# Patient Record
Sex: Male | Born: 1965 | Race: Black or African American | Hispanic: No | Marital: Married | Smoking: Light tobacco smoker
Health system: Southern US, Community
[De-identification: ages and names within clinical notes are randomized; demographics above are authoritative.]

## PROBLEM LIST (undated history)

## (undated) DIAGNOSIS — R079 Chest pain, unspecified: Secondary | ICD-10-CM

## (undated) DIAGNOSIS — J309 Allergic rhinitis, unspecified: Secondary | ICD-10-CM

## (undated) DIAGNOSIS — F419 Anxiety disorder, unspecified: Secondary | ICD-10-CM

## (undated) DIAGNOSIS — T7840XA Allergy, unspecified, initial encounter: Secondary | ICD-10-CM

## (undated) HISTORY — DX: Chest pain, unspecified: R07.9

## (undated) HISTORY — DX: Allergy, unspecified, initial encounter: T78.40XA

## (undated) HISTORY — DX: Anxiety disorder, unspecified: F41.9

## (undated) HISTORY — PX: CHOLECYSTECTOMY: SHX55

## (undated) HISTORY — DX: Allergic rhinitis, unspecified: J30.9

---

## 2010-04-30 ENCOUNTER — Encounter: Payer: Self-pay | Admitting: Family Medicine

## 2010-04-30 ENCOUNTER — Ambulatory Visit: Payer: Self-pay | Admitting: Family Medicine

## 2010-04-30 DIAGNOSIS — J302 Other seasonal allergic rhinitis: Secondary | ICD-10-CM

## 2010-04-30 DIAGNOSIS — R209 Unspecified disturbances of skin sensation: Secondary | ICD-10-CM | POA: Insufficient documentation

## 2010-04-30 DIAGNOSIS — J3089 Other allergic rhinitis: Secondary | ICD-10-CM

## 2010-05-01 LAB — CONVERTED CEMR LAB
ALT: 50 units/L (ref 0–53)
BUN: 19 mg/dL (ref 6–23)
Basophils Absolute: 0 10*3/uL (ref 0.0–0.1)
Bilirubin, Direct: 0.1 mg/dL (ref 0.0–0.3)
Cholesterol: 162 mg/dL (ref 0–200)
Creatinine, Ser: 1 mg/dL (ref 0.4–1.5)
Eosinophils Relative: 1.4 % (ref 0.0–5.0)
GFR calc non Af Amer: 105.39 mL/min (ref >60)
Glucose, Bld: 91 mg/dL (ref 70–99)
HDL: 56.2 mg/dL (ref >39.00)
LDL Cholesterol: 98 mg/dL (ref 0–99)
Monocytes Absolute: 0.3 10*3/uL (ref 0.1–1.0)
Monocytes Relative: 5.1 % (ref 3.0–12.0)
Neutrophils Relative %: 42.1 % — ABNORMAL LOW (ref 43.0–77.0)
Platelets: 181 10*3/uL (ref 150.0–400.0)
RDW: 13.1 % (ref 11.5–14.6)
TSH: 1.65 microintl units/mL (ref 0.35–5.50)
Total Bilirubin: 0.6 mg/dL (ref 0.3–1.2)
Triglycerides: 38 mg/dL (ref 0.0–149.0)
VLDL: 7.6 mg/dL (ref 0.0–40.0)
WBC: 5.3 10*3/uL (ref 4.5–10.5)

## 2010-08-08 NOTE — Assessment & Plan Note (Signed)
Summary: NEW PT CPX AND FASTING LABS///SP[facility]   Vital Signs:  Patient profile:   45 year old male Height:      66 inches Weight:      194 pounds BMI:     31.43 Pulse rate:   62 / minute BP sitting:   112 / 84  (right arm)  Vitals Entered By: Doristine Devoid CMA (April 30, 2010 8:40 AM) CC: NEW EST- CPX AND LABS    History of Present Illness: 45 yo man here today for CPE.  Previous MD- none.    parasthesias- L arm.  started 10 days ago.  will start in the shoulder and radiate down the arm into the hand.  lifts regularly, no known injury.  denies weakness.  denies neck or shoulder pain.  no hx of similar.  feeling is constant.  nothing makes feeling better, nothing makes it worse.  Preventive Screening-Counseling & Management  Alcohol-Tobacco     Alcohol drinks/day: <1     Smoking Status: never  Caffeine-Diet-Exercise     Does Patient Exercise: yes     Type of exercise: lifting     Times/week: 3      Sexual History:  currently monogamous.        Drug Use:  never.    Current Medications (verified): 1)  None  Allergies (verified): 1)  ! 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  The patient denies anorexia, fever, weight loss, weight gain, vision loss, decreased hearing, hoarseness, syncope, dyspnea on exertion, peripheral edema, prolonged cough, headaches, abdominal pain, melena, hematochezia, severe indigestion/heartburn, hematuria, suspicious skin lesions, depression, abnormal bleeding, enlarged lymph nodes, and testicular masses.           chest pain- will have sharp, short pains in upper part of chest infrequently  Physical Exam  General:  Well-developed,well-nourished,in no acute distress; alert,appropriate and cooperative throughout examination Head:  Normocephalic and atraumatic without obvious abnormalities. No apparent alopecia or balding. Eyes:  eyes prominent.  No corneal or conjunctival inflammation noted. EOMI. Perrla. Funduscopic exam benign, without hemorrhages, exudates or papilledema. Vision grossly normal. Ears:  External ear exam shows no significant lesions or deformities.  Otoscopic examination reveals clear canals, tympanic membranes are intact bilaterally without bulging, retraction, inflammation or discharge. Hearing is grossly normal bilaterally. Nose:  External nasal examination shows no deformity or inflammation. Nasal mucosa are pink and moist without lesions or exudates. Mouth:  Oral mucosa and oropharynx without lesions or exudates.  Teeth in good repair. Neck:  No deformities, masses, or tenderness noted.  + trapezius spasm on L, very muscular Lungs:  Normal respiratory effort, chest expands symmetrically. Lungs are clear to auscultation, no crackles or wheezes. Heart:  Normal rate and regular rhythm. S1 and S2 normal without gallop, murmur, click, rub or other extra sounds. Abdomen:  Bowel sounds positive,abdomen soft and non-tender without masses, organomegaly or hernias noted. Genitalia:  Testes bilaterally descended without nodularity, tenderness or masses. No scrotal masses or lesions. No penis lesions or urethral discharge. Pulses:  +2 carotid, radial, DP Extremities:  No clubbing, cyanosis,  edema, or deformity noted with normal full range of motion of all joints.   Neurologic:  No cranial nerve deficits noted. Station and gait are normal. Plantar reflexes are down-going bilaterally. DTRs are symmetrical throughout. Sensory, motor and coordinative functions appear intact. Skin:  Intact  without suspicious lesions or rashes Cervical Nodes:  No lymphadenopathy noted Inguinal Nodes:  No significant adenopathy Psych:  Cognition and judgment appear intact. Alert and cooperative with normal attention span and concentration. No apparent delusions, illusions, hallucinations   Impression & Recommendations:  Problem # 1:  PHYSICAL EXAMINATION (ICD-V70.0) Assessment New pt's PE WNL w/ exception of trap spasm (see below).  check labs.  EKG obtained as baseline- normal.  anticipatory guidance provided. Orders: Venipuncture (60454) Specimen Handling (09811) EKG w/ Interpretation (93000) TLB-Lipid Panel (80061-LIPID) TLB-BMP (Basic Metabolic Panel-BMET) (80048-METABOL) TLB-CBC Platelet - w/Differential (85025-CBCD) TLB-Hepatic/Liver Function Pnl (80076-HEPATIC) TLB-TSH (Thyroid Stimulating Hormone) (84443-TSH)  Problem # 2:  PARESTHESIA (ICD-782.0) Assessment: New pt w/ tight trap spasm on L.  can reproduce parasthesia w/ compression of trap.  most likely due to nerve compression due to muscle spasm.  start NSAIDs and muscle relaxers.  if no improvement in 2 weeks will refer to ortho.  Complete Medication List: 1)  Naprosyn 500 Mg Tabs (Naproxen) .Marland Kitchen.. 1 two times a day x7-10 days and then as needed.  take w/ food. 2)  Cyclobenzaprine Hcl 10 Mg Tabs (Cyclobenzaprine hcl) .Marland Kitchen.. 1 by mouth 2 times daily as needed for back pain.  will cause drowsiness  Patient Instructions: 1)  Please call if your arm numbness isn't better in the next 10-14 days 2)  We'll notify you of your lab results 3)  Start the Naprosyn as directed for inflammation and pain- take w/ food 4)  Use the cyclobenzaprine (muscle relaxer) at night- will cause drowsiness 5)  Use a heating pad for pain relief 6)  Call with any questions or concerns 7)  Welcome!  We're glad to have you! Prescriptions: CYCLOBENZAPRINE HCL 10 MG  TABS (CYCLOBENZAPRINE HCL) 1 by mouth 2 times daily as needed for back pain.  will cause  drowsiness  #20 x 0   Entered and Authorized by:   Neena Rhymes MD   Signed by:   Neena Rhymes MD on 04/30/2010   Method used:   Print then Give to Patient   RxID:   [id number] NAPROSYN 500 MG TABS (NAPROXEN) 1 two times a day x7-10 days and then as needed.  take w/ food.  #60 x 0   Entered and Authorized by:   Neena Rhymes MD   Signed by:   Neena Rhymes MD on 04/30/2010   Method used:   Print then Give to Patient   RxID:   [id number]    Orders Added: 1)  Venipuncture [40102] 2)  Specimen Handling [99000] 3)  EKG w/ Interpretation [93000] 4)  TLB-Lipid Panel [80061-LIPID] 5)  TLB-BMP (Basic Metabolic Panel-BMET) [80048-METABOL] 6)  TLB-CBC Platelet - w/Differential [85025-CBCD] 7)  TLB-Hepatic/Liver Function Pnl [80076-HEPATIC] 8)  TLB-TSH (Thyroid Stimulating Hormone) [84443-TSH] 9)  New Patient 40-64 years [99386] 43)  New Patient Level II [72536]

## 2012-07-09 ENCOUNTER — Encounter: Payer: Self-pay | Admitting: Family Medicine

## 2012-12-06 ENCOUNTER — Ambulatory Visit (INDEPENDENT_AMBULATORY_CARE_PROVIDER_SITE_OTHER): Payer: Managed Care, Other (non HMO) | Admitting: Family Medicine

## 2012-12-06 ENCOUNTER — Encounter: Payer: Self-pay | Admitting: Family Medicine

## 2012-12-06 VITALS — BP 108/80 | HR 96 | Temp 98.2°F | Ht 65.25 in | Wt 194.4 lb

## 2012-12-06 DIAGNOSIS — R209 Unspecified disturbances of skin sensation: Secondary | ICD-10-CM

## 2012-12-06 DIAGNOSIS — Z0000 Encounter for general adult medical examination without abnormal findings: Secondary | ICD-10-CM

## 2012-12-06 DIAGNOSIS — G4733 Obstructive sleep apnea (adult) (pediatric): Secondary | ICD-10-CM | POA: Insufficient documentation

## 2012-12-06 LAB — BASIC METABOLIC PANEL
BUN: 20 mg/dL (ref 6–23)
CO2: 25 mEq/L (ref 19–32)
Calcium: 9.6 mg/dL (ref 8.4–10.5)
Creatinine, Ser: 1.2 mg/dL (ref 0.4–1.5)
GFR: 80.34 mL/min (ref >60.00)
Glucose, Bld: 98 mg/dL (ref 70–99)
Sodium: 138 mEq/L (ref 135–145)

## 2012-12-06 LAB — CBC WITH DIFFERENTIAL/PLATELET
Basophils Relative: 0.5 % (ref 0.0–3.0)
Eosinophils Relative: 1.4 % (ref 0.0–5.0)
HCT: 46.1 % (ref 39.0–52.0)
Hemoglobin: 15.8 g/dL (ref 13.0–17.0)
Lymphs Abs: 2.3 10*3/uL (ref 0.7–4.0)
MCV: 89.1 fl (ref 78.0–100.0)
Monocytes Absolute: 0.3 10*3/uL (ref 0.1–1.0)
Monocytes Relative: 5.3 % (ref 3.0–12.0)
Neutro Abs: 2.7 10*3/uL (ref 1.4–7.7)
Platelets: 224 10*3/uL (ref 150.0–400.0)
WBC: 5.4 10*3/uL (ref 4.5–10.5)

## 2012-12-06 LAB — LIPID PANEL
Cholesterol: 157 mg/dL (ref 0–200)
HDL: 66.2 mg/dL (ref >39.00)
Total CHOL/HDL Ratio: 2
Triglycerides: 75 mg/dL (ref 0.0–149.0)

## 2012-12-06 LAB — TSH: TSH: 1.47 u[IU]/mL (ref 0.35–5.50)

## 2012-12-06 LAB — HEPATIC FUNCTION PANEL
Albumin: 3.9 g/dL (ref 3.5–5.2)
Total Protein: 7.4 g/dL (ref 6.0–8.3)

## 2012-12-06 MED ORDER — MELOXICAM 15 MG PO TABS: 15.0000 mg | ORAL_TABLET | Freq: Every day | ORAL | Status: DC

## 2012-12-06 NOTE — Progress Notes (Signed)
  Subjective:    Patient ID: Daniel Mccoy, male    DOB: 11-May-1966, 47 y.o.   MRN: [id number]  HPI Snoring- pt's wife reports 'he'll snore really hard and then I won't hear nothing'.  Will only sleep a few hours at a time.  Will wake up tired.  Increased stress during the day.  Wife reports he has always snored but worse recently.  Wife reports breathing pauses w/ gasping for air.  No known family hx of sleep apnea.  Wife reports pt will fall asleep at stop lights, jazz concerts, during conversation.  Numbness in R hand- numbness in all fingers.  sxs are intermittent.  sxs started ~1 week ago.  No known injury.  Pt works as Marketing executive w/ some computer work.  Pt is ambidextrous.  Pt lifts weights regularly.   Review of Systems For ROS see HPI     Objective:   Physical Exam  Vitals reviewed. Constitutional: He is oriented to person, place, and time. He appears well-developed and well-nourished. No distress.  HENT:  Head: Normocephalic and atraumatic.  Narrow palantine arch  Eyes: Conjunctivae and EOM are normal. Pupils are equal, round, and reactive to light.  Neck: Normal range of motion. Neck supple. No thyromegaly present.  Very muscular  Cardiovascular: Normal rate, regular rhythm, normal heart sounds and intact distal pulses.   No murmur heard. Pulmonary/Chest: Effort normal and breath sounds normal. No respiratory distress.  Abdominal: Soft. Bowel sounds are normal. He exhibits no distension.  Musculoskeletal: He exhibits no edema.  Lymphadenopathy:    He has no cervical adenopathy.  Neurological: He is alert and oriented to person, place, and time. He has normal reflexes. No cranial nerve deficit.  + tinnel's on R, (-) phalen's.  Skin: Skin is warm and dry.  Psychiatric: He has a normal mood and affect. His behavior is normal.          Assessment & Plan:

## 2012-12-06 NOTE — Patient Instructions (Addendum)
Schedule your complete physical at your convenience in the next 6-8 weeks We'll notify you of your lab results Someone will call you with your hand surgery and pulmonary appts Start the Mobic daily for inflammation Call with any questions or concerns Hang in there!!!

## 2012-12-07 ENCOUNTER — Encounter: Payer: Self-pay | Admitting: General Practice

## 2012-12-07 NOTE — Assessment & Plan Note (Signed)
Pt having continued numbness in R hand.  Suspect component of carpal tunnel.  Start daily NSAID.  Refer to hand specialist.  Pt expressed understanding and is in agreement w/ plan.

## 2012-12-07 NOTE — Assessment & Plan Note (Signed)
Pt's wife's description is highly suspicious for OSA.  Refer to pulm for complete evaluation and tx.  Pt expressed understanding and is in agreement w/ plan.

## 2012-12-21 ENCOUNTER — Institutional Professional Consult (permissible substitution): Payer: Managed Care, Other (non HMO) | Admitting: Pulmonary Disease

## 2013-02-01 ENCOUNTER — Institutional Professional Consult (permissible substitution): Payer: Managed Care, Other (non HMO) | Admitting: Pulmonary Disease

## 2013-10-19 ENCOUNTER — Encounter: Payer: Self-pay | Admitting: Family Medicine

## 2013-10-19 ENCOUNTER — Ambulatory Visit (INDEPENDENT_AMBULATORY_CARE_PROVIDER_SITE_OTHER): Payer: Managed Care, Other (non HMO) | Admitting: Family Medicine

## 2013-10-19 VITALS — BP 120/82 | HR 62 | Temp 98.0°F | Resp 16 | Wt 202.2 lb

## 2013-10-19 DIAGNOSIS — F411 Generalized anxiety disorder: Secondary | ICD-10-CM

## 2013-10-19 DIAGNOSIS — R079 Chest pain, unspecified: Secondary | ICD-10-CM | POA: Insufficient documentation

## 2013-10-19 MED ORDER — FLUOXETINE HCL 20 MG PO TABS: 20.0000 mg | ORAL_TABLET | Freq: Every day | ORAL | Status: DC

## 2013-10-19 NOTE — Assessment & Plan Note (Signed)
New.  Start medication.  Stressed need to find emotional outlet.  Will follow.

## 2013-10-19 NOTE — Patient Instructions (Signed)
Follow up in 4-6 weeks to recheck stress Start the Fluoxetine daily for mood and stress Try and find a stress outlet- exercise, art, music (you can even talk about how you feel!) Call with any questions or concerns Hang in there!!

## 2013-10-19 NOTE — Progress Notes (Signed)
   Subjective:    Patient ID: Daniel Mccoy, male    DOB: Dec 23, 1965, 48 y.o.   MRN: [id number]  HPI 'stress'- pt reports L arm tingling.  'he goes from 0 to 100 just like that' per wife.  'he's bringing a lot of anxiety from work home.  Whole demeanor has changed'.  Sleeping w/o difficulty but has increased desire to sleep.  Some decreased motivation.  Feeling 'blah'.  Not sad or tearful.  Mild CP on L.   Review of Systems For ROS see HPI     Objective:   Physical Exam  Vitals reviewed. Constitutional: He is oriented to person, place, and time. He appears well-developed and well-nourished. No distress.  HENT:  Head: Normocephalic and atraumatic.  Eyes: Conjunctivae and EOM are normal. Pupils are equal, round, and reactive to light.  Neck: Normal range of motion. Neck supple. No thyromegaly present.  Cardiovascular: Normal rate, regular rhythm, normal heart sounds and intact distal pulses.   No murmur heard. Pulmonary/Chest: Effort normal and breath sounds normal. No respiratory distress.  Abdominal: Soft. Bowel sounds are normal. He exhibits no distension.  Musculoskeletal: He exhibits no edema.  Lymphadenopathy:    He has no cervical adenopathy.  Neurological: He is alert and oriented to person, place, and time. No cranial nerve deficit.  Skin: Skin is warm and dry.  Psychiatric: He has a normal mood and affect. His behavior is normal.          Assessment & Plan:

## 2013-10-19 NOTE — Assessment & Plan Note (Signed)
New.  Atypical and unlikely to be cardiac.  EKG WNL.  Suspect this is all due to pt's anxiety.  Reviewed supportive care and red flags that should prompt return.  Pt expressed understanding and is in agreement w/ plan.

## 2013-10-19 NOTE — Progress Notes (Signed)
Pre visit review using our clinic review tool, if applicable. No additional management support is needed unless otherwise documented below in the visit note. 

## 2013-10-21 ENCOUNTER — Ambulatory Visit: Payer: Managed Care, Other (non HMO) | Admitting: Family Medicine

## 2013-11-23 ENCOUNTER — Ambulatory Visit: Payer: Managed Care, Other (non HMO) | Admitting: Family Medicine

## 2013-11-23 DIAGNOSIS — Z0289 Encounter for other administrative examinations: Secondary | ICD-10-CM

## 2014-11-21 ENCOUNTER — Telehealth: Payer: Self-pay | Admitting: Family Medicine

## 2014-11-21 NOTE — Telephone Encounter (Signed)
Pre Visit letter sent  °

## 2014-12-12 ENCOUNTER — Encounter: Payer: Self-pay | Admitting: Family Medicine

## 2014-12-12 ENCOUNTER — Ambulatory Visit (INDEPENDENT_AMBULATORY_CARE_PROVIDER_SITE_OTHER): Payer: BLUE CROSS/BLUE SHIELD | Admitting: Family Medicine

## 2014-12-12 VITALS — BP 122/82 | HR 67 | Temp 98.3°F | Resp 16 | Ht 65.0 in | Wt 197.4 lb

## 2014-12-12 DIAGNOSIS — Z0000 Encounter for general adult medical examination without abnormal findings: Secondary | ICD-10-CM

## 2014-12-12 LAB — CBC WITH DIFFERENTIAL/PLATELET
BASOS ABS: 0 10*3/uL (ref 0.0–0.1)
BASOS PCT: 0.4 % (ref 0.0–3.0)
Eosinophils Absolute: 0 10*3/uL (ref 0.0–0.7)
Eosinophils Relative: 1 % (ref 0.0–5.0)
HCT: 45.4 % (ref 39.0–52.0)
Hemoglobin: 15.3 g/dL (ref 13.0–17.0)
LYMPHS ABS: 2.3 10*3/uL (ref 0.7–4.0)
Lymphocytes Relative: 46.9 % — ABNORMAL HIGH (ref 12.0–46.0)
MCHC: 33.8 g/dL (ref 30.0–36.0)
MCV: 90.3 fl (ref 78.0–100.0)
Monocytes Absolute: 0.3 10*3/uL (ref 0.1–1.0)
Monocytes Relative: 7 % (ref 3.0–12.0)
NEUTROS ABS: 2.2 10*3/uL (ref 1.4–7.7)
NEUTROS PCT: 44.7 % (ref 43.0–77.0)
Platelets: 196 10*3/uL (ref 150.0–400.0)
RBC: 5.03 Mil/uL (ref 4.22–5.81)
RDW: 13.8 % (ref 11.5–15.5)
WBC: 4.9 10*3/uL (ref 4.0–10.5)

## 2014-12-12 LAB — BASIC METABOLIC PANEL
BUN: 20 mg/dL (ref 6–23)
CALCIUM: 9.4 mg/dL (ref 8.4–10.5)
CO2: 24 mEq/L (ref 19–32)
CREATININE: 0.93 mg/dL (ref 0.40–1.50)
Chloride: 105 mEq/L (ref 96–112)
GFR: 111.03 mL/min (ref >60.00)
Glucose, Bld: 96 mg/dL (ref 70–99)
Potassium: 4 mEq/L (ref 3.5–5.1)
Sodium: 137 mEq/L (ref 135–145)

## 2014-12-12 LAB — HEPATIC FUNCTION PANEL
ALK PHOS: 51 U/L (ref 39–117)
ALT: 31 U/L (ref 0–53)
AST: 20 U/L (ref 0–37)
Albumin: 4.3 g/dL (ref 3.5–5.2)
Bilirubin, Direct: 0.1 mg/dL (ref 0.0–0.3)
TOTAL PROTEIN: 6.9 g/dL (ref 6.0–8.3)
Total Bilirubin: 0.6 mg/dL (ref 0.2–1.2)

## 2014-12-12 LAB — LIPID PANEL
Cholesterol: 160 mg/dL (ref 0–200)
HDL: 57.6 mg/dL (ref >39.00)
LDL Cholesterol: 91 mg/dL (ref 0–99)
NonHDL: 102.4
Total CHOL/HDL Ratio: 3
Triglycerides: 58 mg/dL (ref 0.0–149.0)
VLDL: 11.6 mg/dL (ref 0.0–40.0)

## 2014-12-12 LAB — PSA: PSA: 0.32 ng/mL (ref 0.10–4.00)

## 2014-12-12 LAB — TSH: TSH: 1.48 u[IU]/mL (ref 0.35–4.50)

## 2014-12-12 NOTE — Progress Notes (Signed)
Pre visit review using our clinic review tool, if applicable. No additional management support is needed unless otherwise documented below in the visit note. 

## 2014-12-12 NOTE — Progress Notes (Signed)
   Subjective:    Patient ID: Daniel Mccoy, male    DOB: 03/13/1966, 49 y.o.   MRN: [id number]  HPI CPE- no concerns today   Review of Systems Patient reports no vision/hearing changes, anorexia, fever ,adenopathy, persistant/recurrent hoarseness, swallowing issues, chest pain, palpitations, edema, persistant/recurrent cough, hemoptysis, dyspnea (rest,exertional, paroxysmal nocturnal), gastrointestinal  bleeding (melena, rectal bleeding), abdominal pain, GU symptoms (dysuria, hematuria, voiding/incontinence issues) syncope, focal weakness, memory loss, numbness & tingling, skin/hair/nail changes, depression, anxiety, abnormal bruising/bleeding, musculoskeletal symptoms/signs.   + GERD    Objective:   Physical Exam BP 122/82 mmHg  Pulse 67  Temp(Src) 98.3 F (36.8 C) (Oral)  Resp 16  Ht 5\' 5"  (1.651 m)  Wt 197 lb 6 oz (89.529 kg)  BMI 32.85 kg/m2  SpO2 96%  General Appearance:    Alert, cooperative, no distress, appears stated age  Head:    Normocephalic, without obvious abnormality, atraumatic  Eyes:    PERRL, conjunctiva/corneas clear, EOM's intact, fundi    benign, both eyes       Ears:    Normal TM's and external ear canals, both ears  Nose:   Nares normal, septum midline, mucosa normal, no drainage   or sinus tenderness  Throat:   Lips, mucosa, and tongue normal; teeth and gums normal  Neck:   Supple, symmetrical, trachea midline, no adenopathy;       thyroid:  No enlargement/tenderness/nodules; no carotid   bruit or JVD  Back:     Symmetric, no curvature, ROM normal, no CVA tenderness  Lungs:     Clear to auscultation bilaterally, respirations unlabored  Chest wall:    No tenderness or deformity  Heart:    Regular rate and rhythm, S1 and S2 normal, no murmur, rub   or gallop  Abdomen:     Soft, non-tender, bowel sounds active all four quadrants,    no masses, no organomegaly  Genitalia:    Normal male without lesion, discharge or tenderness  Rectal:    Normal tone,  normal prostate, no masses or tenderness;   guaiac negative stool  Extremities:   Extremities normal, atraumatic, no cyanosis or edema  Pulses:   2+ and symmetric all extremities  Skin:   Skin color, texture, turgor normal, no rashes or lesions  Lymph nodes:   Cervical, supraclavicular, and axillary nodes normal  Neurologic:   CNII-XII intact. Normal strength, sensation and reflexes      throughout          Assessment & Plan:

## 2014-12-12 NOTE — Assessment & Plan Note (Signed)
Pt's PE WNL.  Check labs.  Anticipatory guidance provided.  

## 2014-12-12 NOTE — Patient Instructions (Signed)
Follow up in 1 year or as needed We'll notify you of your lab results and make any changes if needed Try and find a stress outlet- you deserve it! Try and limit acidic, spicy foods or carbonated beverages- especially all at once If you develop reflux, try Tums as needed Call with any questions or concerns Have a great summer!!!

## 2014-12-13 ENCOUNTER — Encounter: Payer: Self-pay | Admitting: General Practice

## 2015-12-14 ENCOUNTER — Encounter: Payer: BLUE CROSS/BLUE SHIELD | Admitting: Family Medicine

## 2016-10-29 ENCOUNTER — Encounter: Payer: Self-pay | Admitting: Family Medicine

## 2016-10-29 ENCOUNTER — Ambulatory Visit (INDEPENDENT_AMBULATORY_CARE_PROVIDER_SITE_OTHER): Payer: 59 | Admitting: Family Medicine

## 2016-10-29 VITALS — BP 118/82 | HR 81 | Temp 98.1°F | Resp 17 | Ht 65.0 in | Wt 194.0 lb

## 2016-10-29 DIAGNOSIS — G4733 Obstructive sleep apnea (adult) (pediatric): Secondary | ICD-10-CM

## 2016-10-29 DIAGNOSIS — Z1211 Encounter for screening for malignant neoplasm of colon: Secondary | ICD-10-CM | POA: Diagnosis not present

## 2016-10-29 DIAGNOSIS — Z0000 Encounter for general adult medical examination without abnormal findings: Secondary | ICD-10-CM | POA: Diagnosis not present

## 2016-10-29 LAB — HEPATIC FUNCTION PANEL
ALBUMIN: 4.6 g/dL (ref 3.5–5.2)
ALT: 27 U/L (ref 0–53)
AST: 20 U/L (ref 0–37)
Alkaline Phosphatase: 58 U/L (ref 39–117)
BILIRUBIN TOTAL: 0.7 mg/dL (ref 0.2–1.2)
Bilirubin, Direct: 0.2 mg/dL (ref 0.0–0.3)
TOTAL PROTEIN: 7.2 g/dL (ref 6.0–8.3)

## 2016-10-29 LAB — BASIC METABOLIC PANEL
BUN: 17 mg/dL (ref 6–23)
CALCIUM: 9.8 mg/dL (ref 8.4–10.5)
CO2: 29 meq/L (ref 19–32)
Chloride: 105 mEq/L (ref 96–112)
Creatinine, Ser: 0.97 mg/dL (ref 0.40–1.50)
GFR: 104.96 mL/min (ref >60.00)
GLUCOSE: 85 mg/dL (ref 70–99)
Potassium: 4.2 mEq/L (ref 3.5–5.1)
SODIUM: 140 meq/L (ref 135–145)

## 2016-10-29 LAB — LIPID PANEL
CHOLESTEROL: 156 mg/dL (ref 0–200)
HDL: 69.9 mg/dL (ref >39.00)
LDL Cholesterol: 77 mg/dL (ref 0–99)
NonHDL: 86.01
Total CHOL/HDL Ratio: 2
Triglycerides: 43 mg/dL (ref 0.0–149.0)
VLDL: 8.6 mg/dL (ref 0.0–40.0)

## 2016-10-29 LAB — CBC WITH DIFFERENTIAL/PLATELET
BASOS ABS: 0 10*3/uL (ref 0.0–0.1)
Basophils Relative: 0.4 % (ref 0.0–3.0)
EOS ABS: 0.1 10*3/uL (ref 0.0–0.7)
Eosinophils Relative: 1 % (ref 0.0–5.0)
HEMATOCRIT: 45.9 % (ref 39.0–52.0)
HEMOGLOBIN: 15.2 g/dL (ref 13.0–17.0)
Lymphocytes Relative: 51.9 % — ABNORMAL HIGH (ref 12.0–46.0)
Lymphs Abs: 2.8 10*3/uL (ref 0.7–4.0)
MCHC: 33.1 g/dL (ref 30.0–36.0)
MCV: 92.8 fl (ref 78.0–100.0)
MONO ABS: 0.3 10*3/uL (ref 0.1–1.0)
Monocytes Relative: 5.1 % (ref 3.0–12.0)
Neutro Abs: 2.2 10*3/uL (ref 1.4–7.7)
Neutrophils Relative %: 41.6 % — ABNORMAL LOW (ref 43.0–77.0)
Platelets: 207 10*3/uL (ref 150.0–400.0)
RBC: 4.94 Mil/uL (ref 4.22–5.81)
RDW: 13.6 % (ref 11.5–15.5)
WBC: 5.4 10*3/uL (ref 4.0–10.5)

## 2016-10-29 LAB — PSA: PSA: 0.37 ng/mL (ref 0.10–4.00)

## 2016-10-29 LAB — TSH: TSH: 1.6 u[IU]/mL (ref 0.35–4.50)

## 2016-10-29 NOTE — Assessment & Plan Note (Signed)
Pt's PE WNL w/ exception of being overweight and external rectal skin tag.  Due for colonoscopy- referral placed.  UTD on Tdap.  Anticipatory guidance provided.

## 2016-10-29 NOTE — Progress Notes (Signed)
Pre visit review using our clinic review tool, if applicable. No additional management support is needed unless otherwise documented below in the visit note. 

## 2016-10-29 NOTE — Progress Notes (Signed)
   Subjective:    Patient ID: Daniel Mccoy, male    DOB: 23-Mar-1966, 51 y.o.   MRN: [id number]  HPI CPE- UTD on Tdap.  Due for colonoscopy- pt open to referral.   Review of Systems Patient reports no vision/hearing changes, anorexia, fever ,adenopathy, persistant/recurrent hoarseness, swallowing issues, chest pain, palpitations, edema, persistant/recurrent cough, hemoptysis, dyspnea (rest,exertional, paroxysmal nocturnal), gastrointestinal  bleeding (melena, rectal bleeding), abdominal pain, excessive heart burn, GU symptoms (dysuria, hematuria, voiding/incontinence issues) syncope, focal weakness, memory loss, numbness & tingling, skin/hair/nail changes, depression, anxiety, abnormal bruising/bleeding, musculoskeletal symptoms/signs.   + increased snoring and feeling of being poorly rested- hx of OSA but not being tx'd    Objective:   Physical Exam BP 118/82   Pulse 81   Temp 98.1 F (36.7 C) (Oral)   Resp 17   Ht  (1.651 m)   Wt 194 lb (88 kg)   SpO2 98%   BMI 32.28 kg/m   General Appearance:    Alert, cooperative, no distress, appears stated age  Head:    Normocephalic, without obvious abnormality, atraumatic  Eyes:    PERRL, conjunctiva/corneas clear, EOM's intact, fundi    benign, both eyes       Ears:    Normal TM's and external ear canals, both ears  Nose:   Nares normal, septum midline, mucosa normal, no drainage   or sinus tenderness  Throat:   Lips, mucosa, and tongue normal; teeth and gums normal  Neck:   Supple, symmetrical, trachea midline, no adenopathy;       thyroid:  No enlargement/tenderness/nodules  Back:     Symmetric, no curvature, ROM normal, no CVA tenderness  Lungs:     Clear to auscultation bilaterally, respirations unlabored  Chest wall:    No tenderness or deformity  Heart:    Regular rate and rhythm, S1 and S2 normal, no murmur, rub   or gallop  Abdomen:     Soft, non-tender, bowel sounds active all four quadrants,    no masses, no  organomegaly  Genitalia:    Normal male without lesion, discharge or tenderness  Rectal:    Normal tone, normal prostate, no tenderness.  + external skin tag (pt was clenching very tightly during exam- making it difficult)  Extremities:   Extremities normal, atraumatic, no cyanosis or edema  Pulses:   2+ and symmetric all extremities  Skin:   Skin color, texture, turgor normal, no rashes or lesions  Lymph nodes:   Cervical, supraclavicular, and axillary nodes normal  Neurologic:   CNII-XII intact. Normal strength, sensation and reflexes      throughout          Assessment & Plan:

## 2016-10-29 NOTE — Patient Instructions (Addendum)
Follow up in 1 year or as needed We'll notify you of your lab results and make any changes if needed Continue to work on healthy diet and regular exercise- you can do it! We'll call you with your pulmonary appt for the sleep apnea We'll call you with your GI appt for the colonoscopy consultation Call with any questions or concerns Happy Early Birthday!!!

## 2016-10-29 NOTE — Assessment & Plan Note (Signed)
Pt has hx of this but is not currently being treated.  He is again having daytime sleepiness and his snoring is louder.  Will refer back to pulmonary.  Pt expressed understanding and is in agreement w/ plan.

## 2016-10-30 ENCOUNTER — Encounter: Payer: Self-pay | Admitting: General Practice

## 2016-12-03 ENCOUNTER — Encounter: Payer: Self-pay | Admitting: Family Medicine

## 2016-12-22 ENCOUNTER — Encounter: Payer: Self-pay | Admitting: Gastroenterology

## 2017-02-20 ENCOUNTER — Encounter: Payer: Self-pay | Admitting: Internal Medicine

## 2017-02-20 ENCOUNTER — Ambulatory Visit (INDEPENDENT_AMBULATORY_CARE_PROVIDER_SITE_OTHER): Payer: 59 | Admitting: Internal Medicine

## 2017-02-20 VITALS — BP 118/68 | HR 56 | Ht 66.0 in | Wt 196.4 lb

## 2017-02-20 DIAGNOSIS — J302 Other seasonal allergic rhinitis: Secondary | ICD-10-CM | POA: Diagnosis not present

## 2017-02-20 DIAGNOSIS — G4733 Obstructive sleep apnea (adult) (pediatric): Secondary | ICD-10-CM | POA: Diagnosis not present

## 2017-02-20 DIAGNOSIS — J3089 Other allergic rhinitis: Secondary | ICD-10-CM

## 2017-02-20 NOTE — Progress Notes (Signed)
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 Referred courtesy of Dr. Beverely Low. Pt states that he has never had a sleep study performed. Wife states that he does snore at night. Pt does not feel rested when he wakes up in the morning at times. And somewhat perennial nasal congestion. He has been working study third shift for several years. Usual bedtime 8:30 AM with 20 minutes sleep latency, waking 2 or 3 times before up between 2 and 3 PM. No sleep medication. "Much coffee and energy drinks". ENT surgery-none. Epworth sleepiness score 24/24. Wife says he snores worse on his back. She hears loud snoring and then witnesses apneas and she pushes him to get in breathing. They do not recognize movement disturbance or parasomnias. He denies heart or lung condition but admits seasonal and perennial nasal congestion.  Prior to Admission medications   Not on File   Past Medical History:  Diagnosis Date  . Allergic rhinitis    History reviewed. No pertinent surgical history. Family History  Problem Relation Age of Onset  . Diabetes Mother   . Heart disease Mother   . Heart disease Maternal Grandmother   . Heart disease Paternal Grandmother    Social History   Social History  . Marital status: Single    Spouse name: N/A  . Number of children: N/A  . Years of education: N/A   Occupational History  . Not on file.   Social History Main Topics  . Smoking status: Current Some Day Smoker    Types: Cigars  . Smokeless tobacco: Never Used     Comment: Pt occassionally will smoke a cigar.  . Alcohol use Yes     Comment: social  . Drug use: No  . Sexual activity: Not on file   Other Topics Concern  . Not on file   Social History Narrative  . No narrative on file   ROS-see HPI   + = Positive Constitutional:    weight loss, night sweats, fevers, chills, +fatigue, lassitude. HEENT:    headaches, difficulty swallowing, tooth/dental problems, sore throat,   sneezing, itching, ear ache, nasal congestion, post nasal drip, snoring CV:    chest pain, orthopnea, PND, swelling in lower extremities, anasarca,                                                  dizziness, palpitations Resp:   shortness of breath with exertion or at rest.                productive cough,   non-productive cough, coughing up of blood.              change in color of mucus.  wheezing.   Skin:    rash or lesions. GI:  No-   heartburn, indigestion, abdominal pain, nausea, vomiting, diarrhea,                 change in bowel habits, loss of appetite GU: dysuria, change in color of urine, no urgency or frequency.   flank pain. MS:   joint pain, stiffness, decreased range of motion, back pain. Neuro-     nothing unusual Psych:  change in mood or affect.  depression or anxiety.   memory loss.  OBJ- Physical Exam General- Alert, Oriented, Affect-appropriate, Distress- none acute Skin- rash-none, lesions- none, excoriation- none  Lymphadenopathy- none Head- atraumatic            Eyes- Gross vision intact, PERRLA, conjunctivae and secretions clear            Ears- Hearing, canals-normal            Nose- Clear, no-Septal dev, mucus, polyps, erosion, perforation             Throat- Mallampati IV , mucosa clear , drainage- none, tonsils- atrophic Neck- flexible , trachea midline, no stridor , thyroid nl, carotid no bruit Chest - symmetrical excursion , unlabored           Heart/CV- RRR , no murmur , no gallop  , no rub, nl s1 s2                           - JVD- none , edema- none, stasis changes- none, varices- none           Lung- clear to P&A, wheeze- none, cough- none , dullness-none, rub- none           Chest wall-  Abd-  Br/ Gen/ Rectal- Not done, not indicated Extrem- cyanosis- none, clubbing, none, atrophy- none, strength- nl Neuro- grossly intact to observation

## 2017-02-20 NOTE — Assessment & Plan Note (Signed)
High probability diagnosis based on history and exam. We discussed the physiology of sleep apnea, basic sleep hygiene, medical concerns, responsibility to drive safely, testing process and common therapies. Plan-schedule sleep study. Depending on the result, if appropriate we will start therapy before his return appointment.

## 2017-02-20 NOTE — Patient Instructions (Signed)
Order- schedule unattended home sleep test     Dx OSA  Please call me for results and recommendation about 2 weeks after your sleep study

## 2017-02-20 NOTE — Assessment & Plan Note (Signed)
Wife recognizes he snores more if he is dealing with nasal congestion. May benefit from study use of Flonase during pollen seasons. Currently denies being symptomatic.

## 2017-02-24 ENCOUNTER — Ambulatory Visit (AMBULATORY_SURGERY_CENTER): Payer: Self-pay

## 2017-02-24 VITALS — Ht 67.0 in | Wt 197.8 lb

## 2017-02-24 DIAGNOSIS — Z1211 Encounter for screening for malignant neoplasm of colon: Secondary | ICD-10-CM

## 2017-02-24 MED ORDER — SUPREP BOWEL PREP KIT 17.5-3.13-1.6 GM/177ML PO SOLN: 1.0000 | Freq: Once | ORAL | 0 refills | Status: AC

## 2017-02-24 NOTE — Progress Notes (Signed)
No allergies to eggs or soy No diet meds No home oxygen No past problems with anesthesia  Declined emmi 

## 2017-02-25 ENCOUNTER — Encounter: Payer: Self-pay | Admitting: Gastroenterology

## 2017-02-26 ENCOUNTER — Telehealth: Payer: Self-pay | Admitting: Internal Medicine

## 2017-02-26 NOTE — Telephone Encounter (Signed)
Pt is returning a call regarding the home sleep test that was ordered at last OV.  Will route to [facility] for follow-up.

## 2017-02-27 NOTE — Telephone Encounter (Signed)
I have Daniel Mccoy scheduled to pick up the HST machine on 08/28/20188 @ 4:00pm. He works 3rd shift and the machine will not be back until 4 on Wednesday 03/04/17

## 2017-02-27 NOTE — Telephone Encounter (Signed)
Synetta Fail signed pt out for study.  I made her aware he has returned her call.

## 2017-03-02 ENCOUNTER — Ambulatory Visit (AMBULATORY_SURGERY_CENTER): Payer: 59 | Admitting: Gastroenterology

## 2017-03-02 ENCOUNTER — Encounter: Payer: Self-pay | Admitting: Gastroenterology

## 2017-03-02 VITALS — BP 116/68 | HR 45 | Temp 98.6°F | Resp 17 | Ht 66.0 in | Wt 196.0 lb

## 2017-03-02 DIAGNOSIS — Z1212 Encounter for screening for malignant neoplasm of rectum: Secondary | ICD-10-CM

## 2017-03-02 DIAGNOSIS — Z1211 Encounter for screening for malignant neoplasm of colon: Secondary | ICD-10-CM

## 2017-03-02 MED ORDER — SODIUM CHLORIDE 0.9 % IV SOLN: 500.0000 mL | INTRAVENOUS | Status: DC

## 2017-03-02 NOTE — Progress Notes (Signed)
Pt was given a work note to return tomorrow.  No problems noted in the recovery room.  maw

## 2017-03-02 NOTE — Progress Notes (Signed)
To recovery, report to RN, VSS. 

## 2017-03-02 NOTE — Patient Instructions (Signed)
YOU HAD AN ENDOSCOPIC PROCEDURE TODAY AT THE [facility]:   Refer to the procedure report that was given to you for any specific questions about what was found during the examination.  If the procedure report does not answer your questions, please call your gastroenterologist to clarify.  If you requested that your care partner not be given the details of your procedure findings, then the procedure report has been included in a sealed envelope for you to review at your convenience later.  YOU SHOULD EXPECT: Some feelings of bloating in the abdomen. Passage of more gas than usual.  Walking can help get rid of the air that was put into your GI tract during the procedure and reduce the bloating. If you had a lower endoscopy (such as a colonoscopy or flexible sigmoidoscopy) you may notice spotting of blood in your stool or on the toilet paper. If you underwent a bowel prep for your procedure, you may not have a normal bowel movement for a few days.  Please Note:  You might notice some irritation and congestion in your nose or some drainage.  This is from the oxygen used during your procedure.  There is no need for concern and it should clear up in a day or so.  SYMPTOMS TO REPORT IMMEDIATELY:   Following lower endoscopy (colonoscopy or flexible sigmoidoscopy):  Excessive amounts of blood in the stool  Significant tenderness or worsening of abdominal pains  Swelling of the abdomen that is new, acute  Fever of 100F or higher  For urgent or emergent issues, a gastroenterologist can be reached at any hour by calling [phone].   DIET:  We do recommend a small meal at first, but then you may proceed to your regular diet.  Drink plenty of fluids but you should avoid alcoholic beverages for 24 hours.  ACTIVITY:  You should plan to take it easy for the rest of today and you should NOT DRIVE or use heavy machinery until tomorrow (because of the sedation medicines used during the test).     FOLLOW UP: Our staff will call the number listed on your records the next business day following your procedure to check on you and address any questions or concerns that you may have regarding the information given to you following your procedure. If we do not reach you, we will leave a message.  However, if you are feeling well and you are not experiencing any problems, there is no need to return our call.  We will assume that you have returned to your regular daily activities without incident.  If any biopsies were taken you will be contacted by phone or by letter within the next 1-3 weeks.  Please call us at [phone] if you have not heard about the biopsies in 3 weeks.    SIGNATURES/CONFIDENTIALITY: You and/or your care partner have signed paperwork which will be entered into your electronic medical record.  These signatures attest to the fact that that the information above on your After Visit Summary has been reviewed and is understood.  Full responsibility of the confidentiality of this discharge information lies with you and/or your care-partner.    Handout was given to your care partner on diverticulosis. You may resume your current medications today. Repeat colonoscopy in 10 years. Please call if any questions or concerns.

## 2017-03-02 NOTE — Op Note (Signed)
[facility] Patient Name: Daniel Mccoy Procedure Date: 03/02/2017 11:05 AM MRN: [id number] Endoscopist: Sherilyn Cooter L. Myrtie Neither , MD Age: 51 Referring MD:  Date of Birth: February 03, 1966 Gender: Male Account #: [id number] Procedure:                Colonoscopy Indications:              Screening for colorectal malignant neoplasm, This                            is the patient's first colonoscopy Medicines:                Monitored Anesthesia Care Procedure:                Pre-Anesthesia Assessment:                           - Prior to the procedure, a History and Physical                            was performed, and patient medications and                            allergies were reviewed. The patient's tolerance of                            previous anesthesia was also reviewed. The risks                            and benefits of the procedure and the sedation                            options and risks were discussed with the patient.                            All questions were answered, and informed consent                            was obtained. Prior Anticoagulants: The patient has                            taken no previous anticoagulant or antiplatelet                            agents. ASA Grade Assessment: II - A patient with                            mild systemic disease. After reviewing the risks                            and benefits, the patient was deemed in                            satisfactory condition to undergo the procedure.  After obtaining informed consent, the colonoscope                            was passed under direct vision. Throughout the                            procedure, the patient's blood pressure, pulse, and                            oxygen saturations were monitored continuously. The                            Colonoscope was introduced through the anus and                            advanced to the the cecum,  identified by                            appendiceal orifice and ileocecal valve. The                            colonoscopy was performed without difficulty. The                            patient tolerated the procedure well. The quality                            of the bowel preparation was excellent. The                            ileocecal valve, appendiceal orifice, and rectum                            were photographed. The quality of the bowel                            preparation was evaluated using the BBPS [facility]                            Bowel Preparation Scale) with scores of: Right                            Colon = 3, Transverse Colon = 3 and Left Colon = 3                            (entire mucosa seen well with no residual staining,                            small fragments of stool or opaque liquid). The                            total BBPS score equals 9. The bowel preparation  used was SUPREP. Scope In: 11:11:58 AM Scope Out: 11:25:35 AM Scope Withdrawal Time: 0 hours 10 minutes 23 seconds  Total Procedure Duration: 0 hours 13 minutes 37 seconds  Findings:                 The perianal and digital rectal examinations were                            normal.                           A few medium-mouthed diverticula were found in the                            right colon.                           The exam was otherwise without abnormality on                            direct and retroflexion views. Complications:            No immediate complications. Estimated Blood Loss:     Estimated blood loss: none. Impression:               - Diverticulosis in the right colon.                           - The examination was otherwise normal on direct                            and retroflexion views.                           - No specimens collected. Recommendation:           - Patient has a contact number available for                             emergencies. The signs and symptoms of potential                            delayed complications were discussed with the                            patient. Return to normal activities tomorrow.                            Written discharge instructions were provided to the                            patient.                           - Resume previous diet.                           - Continue present medications.                           -  Repeat colonoscopy in 10 years for screening                            purposes. Henry L. Myrtie Neither, MD 03/02/2017 11:28:42 AM This report has been signed electronically.

## 2017-03-03 ENCOUNTER — Telehealth: Payer: Self-pay | Admitting: Internal Medicine

## 2017-03-03 ENCOUNTER — Telehealth: Payer: Self-pay

## 2017-03-03 NOTE — Telephone Encounter (Signed)
PCC's please advise. Thanks 

## 2017-03-03 NOTE — Telephone Encounter (Signed)
  Follow up Call-  Call back number 03/02/2017  Post procedure Call Back phone  # [phone]  Some recent data might be hidden     Patient questions:  Do you have a fever, pain , or abdominal swelling? No. Pain Score  0 *  Have you tolerated food without any problems? Yes.    Have you been able to return to your normal activities? Yes.    Do you have any questions about your discharge instructions: Diet   No. Medications  No. Follow up visit  No.  Do you have questions or concerns about your Care? No.  Actions: * If pain score is 4 or above: No action needed, pain <4.

## 2017-03-03 NOTE — Telephone Encounter (Signed)
  Follow up Call-  Call back number 03/02/2017  Post procedure Call Back phone  # [phone]  Some recent data might be hidden     Left message

## 2017-03-04 NOTE — Telephone Encounter (Signed)
I have spoken with Mr. Toews and we have him rescheduled to pick up HST Machine 03/05/2017 @ 3:00pm

## 2017-03-09 DIAGNOSIS — G4733 Obstructive sleep apnea (adult) (pediatric): Secondary | ICD-10-CM | POA: Diagnosis not present

## 2017-03-23 ENCOUNTER — Telehealth: Payer: Self-pay | Admitting: Internal Medicine

## 2017-03-23 ENCOUNTER — Other Ambulatory Visit: Payer: Self-pay | Admitting: *Deleted

## 2017-03-23 DIAGNOSIS — G4733 Obstructive sleep apnea (adult) (pediatric): Secondary | ICD-10-CM

## 2017-03-23 NOTE — Telephone Encounter (Signed)
Spoke with pt and advised that per DR Young sleep study results are not available at this time.  PT advised to call back next week.  Pt verbalized understanding.  Nothing further needed

## 2017-03-23 NOTE — Telephone Encounter (Signed)
The study has been read, but results are still being scanned into the computer and are not available to me yet. Please call back next week.

## 2017-03-23 NOTE — Telephone Encounter (Signed)
Spoke with pt, he states he had the sleep study about a week and a half ago and was wondering if CY had the results. He states he received the equipment from our clinic. Please advise CY. Thanks.  Current Outpatient Prescriptions on File Prior to Visit  Medication Sig Dispense Refill  . Multiple Vitamin (MULTIVITAMIN) capsule Take 1 capsule by mouth daily.    Marland Kitchen UNABLE TO FIND Med Name:Prework out powder.     Current Facility-Administered Medications on File Prior to Visit  Medication Dose Route Frequency Provider Last Rate Last Dose  . 0.9 %  sodium chloride infusion  500 mL Intravenous Continuous Sherrilyn Rist, MD        Allergies  Allergen Reactions  . Shellfish Allergy Hives, Swelling and Rash

## 2017-04-22 ENCOUNTER — Telehealth: Payer: Self-pay | Admitting: Internal Medicine

## 2017-04-22 DIAGNOSIS — G4733 Obstructive sleep apnea (adult) (pediatric): Secondary | ICD-10-CM

## 2017-04-22 NOTE — Telephone Encounter (Signed)
Pt is calling for his sleep study results.  CY please advise. Thanks

## 2017-04-22 NOTE — Telephone Encounter (Signed)
Resulted

## 2017-04-22 NOTE — Telephone Encounter (Signed)
Notes recorded by Waymon BudgeYoung, Clinton D, MD on 04/22/2017 at 3:28 PM EDT Home Sleep Test-sleep study showed mild to moderate obstructive sleep apnea, averaging 14 apneas per hour with drops in blood oxygen level. I recommend he start with CPAP. If nasal congestion is a problem, recommend he start OTC Flonase nasal spray, one or 2 sprays each nostril every night at bedtime. Over several days that should help.  Plan-order new DME, knee CPAP auto 5-20, mask of choice, supplies,, humidifier, Airview  Dx OSA  Please be sure he has a return appointment per insurance rags, 31-90 days.   Spoke with patient. He is aware of results. Wishes to proceed with CPAP therapy. Will go ahead and place order. Advised patient to contact the office as soon as he receives his machine so that we can get him scheduled for a follow up. He verbalized understanding. Nothing else needed at time of call.

## 2017-06-04 ENCOUNTER — Encounter: Payer: 59 | Admitting: Internal Medicine

## 2017-06-04 NOTE — Progress Notes (Signed)
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 Referred courtesy of Dr. Beverely Lowabori. Pt states that he has never had a sleep study performed. Wife states that he does snore at night. Pt does not feel rested when he wakes up in the morning at times. And somewhat perennial nasal congestion. He has been working study third shift for several years. Usual bedtime 8:30 AM with 20 minutes sleep latency, waking 2 or 3 times before up between 2 and 3 PM. No sleep medication. "Much coffee and energy drinks". ENT surgery-none. Epworth sleepiness score 24/24. Wife says he snores worse on his back. She hears loud snoring and then witnesses apneas and she pushes him to get in breathing. They do not recognize movement disturbance or parasomnias. He denies heart or lung condition but admits seasonal and perennial nasal congestion.  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 HEENT:    headaches, difficulty swallowing, tooth/dental problems, sore throat,       sneezing, itching, ear ache, nasal congestion, post nasal drip, snoring CV:    chest pain, orthopnea, PND, swelling in lower extremities, anasarca,                                                  dizziness, palpitations Resp:   shortness of breath with exertion or at rest.                productive cough,   non-productive cough, coughing up of blood.              change in color of mucus.  wheezing.   Skin:    rash or lesions. GI:  No-   heartburn, indigestion, abdominal pain, nausea, vomiting, diarrhea,                 change in bowel habits, loss of appetite GU: dysuria, change in color of urine, no urgency or frequency.   flank pain. MS:   joint pain, stiffness, decreased range of motion, back pain. Neuro-     nothing unusual Psych:  change in mood or affect.  depression or anxiety.    memory loss.  OBJ- Physical Exam General- Alert, Oriented, Affect-appropriate, Distress- none acute Skin- rash-none, lesions- none, excoriation- none Lymphadenopathy- none Head- atraumatic            Eyes- Gross vision intact, PERRLA, conjunctivae and secretions clear            Ears- Hearing, canals-normal            Nose- Clear, no-Septal dev, mucus, polyps, erosion, perforation             Throat- Mallampati IV , mucosa clear , drainage- none, tonsils- atrophic Neck- flexible , trachea midline, no stridor , thyroid nl, carotid no bruit Chest - symmetrical excursion , unlabored           Heart/CV- RRR , no murmur , no gallop  , no rub, nl s1 s2                           - JVD- none , edema- none, stasis changes- none, varices- none  Lung- clear to P&A, wheeze- none, cough- none , dullness-none, rub- none           Chest wall-  Abd-  Br/ Gen/ Rectal- Not done, not indicated Extrem- cyanosis- none, clubbing, none, atrophy- none, strength- nl Neuro- grossly intact to observation

## 2017-07-03 ENCOUNTER — Encounter: Payer: Self-pay | Admitting: Internal Medicine

## 2017-07-03 ENCOUNTER — Ambulatory Visit: Payer: 59 | Admitting: Internal Medicine

## 2017-07-03 VITALS — BP 114/62 | HR 78 | Ht 66.0 in | Wt 202.4 lb

## 2017-07-03 DIAGNOSIS — J302 Other seasonal allergic rhinitis: Secondary | ICD-10-CM

## 2017-07-03 DIAGNOSIS — G4733 Obstructive sleep apnea (adult) (pediatric): Secondary | ICD-10-CM | POA: Diagnosis not present

## 2017-07-03 DIAGNOSIS — J3089 Other allergic rhinitis: Secondary | ICD-10-CM | POA: Diagnosis not present

## 2017-07-03 NOTE — Patient Instructions (Signed)
Order- DME [facility]- please change CPAP to auto 5-12, mask of choice, humidifier, supplies, AirView   Dx OSA  Please call us as needed so we can help you get comfortable with CPAP.

## 2017-07-03 NOTE — Assessment & Plan Note (Signed)
Current nasal symptoms have been most consistent with dryness from CPAP airflow and indoor heat.  We discussed adjustment of his CPAP humidifier, use of nasal gel.

## 2017-07-03 NOTE — Progress Notes (Signed)
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 Referred courtesy of Dr. Beverely Lowabori. Pt states that he has never had a sleep study performed. Wife states that he does snore at night. Pt does not feel rested when he wakes up in the morning at times. And somewhat perennial nasal congestion. He has been working steady third shift for several years. Usual bedtime 8:30 AM with 20 minutes sleep latency, waking 2 or 3 times before up between 2 and 3 PM. No sleep medication. "Much coffee and energy drinks". ENT surgery-none. Epworth sleepiness score 24/24. Wife says he snores worse on his back. She hears loud snoring and then witnesses apneas and she pushes him to get in breathing. They do not recognize movement disturbance or parasomnias. He denies heart or lung condition but admits seasonal and perennial nasal congestion.  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 States machine has been blowing out too much air. Uses [facility] as his DME.  Here with wife who says he is not snoring at all with CPAP.  Still works third shift.  Pressure has been uncomfortably high at times and he has had problems with dry nose-helped by saline nasal gel. Download 43% compliance, AHI 0.3/hour.  He feels he is getting used to CPAP and that he does sleep better wearing it.  ROS-see HPI   + = Positive Constitutional:    weight loss, night sweats, fevers, chills, +fatigue, lassitude. HEENT:    headaches, difficulty swallowing, tooth/dental problems, sore throat,       sneezing, itching, ear ache, nasal congestion, post nasal drip, snoring CV:    chest pain, orthopnea, PND, swelling in lower extremities, anasarca,                                                  dizziness, palpitations Resp:   shortness of breath with  exertion or at rest.                productive cough,   non-productive cough, coughing up of blood.              change in color of mucus.  wheezing.   Skin:    rash or lesions. GI:  No-   heartburn, indigestion, abdominal pain, nausea, vomiting, diarrhea,                 change in bowel habits, loss of appetite GU: dysuria, change in color of urine, no urgency or frequency.   flank pain. MS:   joint pain, stiffness, decreased range of motion, back pain. Neuro-     nothing unusual Psych:  change in mood or affect.  depression or anxiety.   memory loss.  OBJ- Physical Exam General- Alert, Oriented, Affect-appropriate, Distress- none acute, looks well  Skin- rash-none, lesions- none, excoriation- none Lymphadenopathy- none Head- atraumatic            Eyes- Gross vision intact, PERRLA, conjunctivae and secretions clear            Ears- Hearing, canals-normal            Nose- Clear, no-Septal dev, mucus, polyps, erosion, perforation  Throat- Mallampati IV , mucosa clear , drainage- none, tonsils- atrophic Neck- flexible , trachea midline, no stridor , thyroid nl, carotid no bruit Chest - symmetrical excursion , unlabored           Heart/CV- RRR , no murmur , no gallop  , no rub, nl s1 s2                           - JVD- none , edema- none, stasis changes- none, varices- none           Lung- clear to P&A, wheeze- none, cough- none , dullness-none, rub- none           Chest wall-  Abd-  Br/ Gen/ Rectal- Not done, not indicated Extrem- cyanosis- none, clubbing, none, atrophy- none, strength- nl Neuro- grossly intact to observation

## 2017-07-03 NOTE — Assessment & Plan Note (Signed)
He had his wife both say he does better with CPAP.  He has had some comfort problems with mask, pressure leaks and dry nose.  We think we can deal with those by reducing the pressure range some and with education. Plan-reduce auto range to 5-12

## 2017-11-02 ENCOUNTER — Ambulatory Visit: Payer: 59 | Admitting: Internal Medicine

## 2018-01-15 ENCOUNTER — Ambulatory Visit: Payer: 59 | Admitting: Internal Medicine

## 2018-04-22 ENCOUNTER — Ambulatory Visit: Payer: 59 | Admitting: Internal Medicine

## 2018-11-18 ENCOUNTER — Encounter: Payer: 59 | Admitting: Family Medicine

## 2019-01-31 ENCOUNTER — Other Ambulatory Visit: Payer: Self-pay

## 2019-01-31 ENCOUNTER — Encounter

## 2019-01-31 ENCOUNTER — Ambulatory Visit (INDEPENDENT_AMBULATORY_CARE_PROVIDER_SITE_OTHER): Payer: 59 | Admitting: Family Medicine

## 2019-01-31 ENCOUNTER — Encounter: Payer: Self-pay | Admitting: Family Medicine

## 2019-01-31 VITALS — BP 118/78 | HR 78 | Temp 98.1°F | Resp 16 | Ht 66.0 in | Wt 189.4 lb

## 2019-01-31 DIAGNOSIS — M7712 Lateral epicondylitis, left elbow: Secondary | ICD-10-CM

## 2019-01-31 DIAGNOSIS — E669 Obesity, unspecified: Secondary | ICD-10-CM | POA: Diagnosis not present

## 2019-01-31 DIAGNOSIS — F411 Generalized anxiety disorder: Secondary | ICD-10-CM

## 2019-01-31 DIAGNOSIS — Z125 Encounter for screening for malignant neoplasm of prostate: Secondary | ICD-10-CM

## 2019-01-31 DIAGNOSIS — Z0001 Encounter for general adult medical examination with abnormal findings: Secondary | ICD-10-CM

## 2019-01-31 DIAGNOSIS — Z0000 Encounter for general adult medical examination without abnormal findings: Secondary | ICD-10-CM

## 2019-01-31 MED ORDER — MELOXICAM 15 MG PO TABS: 15.0000 mg | ORAL_TABLET | Freq: Every day | ORAL | 0 refills | Status: DC

## 2019-01-31 MED ORDER — FLUOXETINE HCL 20 MG PO CAPS: 20.0000 mg | ORAL_CAPSULE | Freq: Every day | ORAL | 3 refills | Status: DC

## 2019-01-31 NOTE — Progress Notes (Signed)
   Subjective:    Patient ID: Daniel Mccoy, male    DOB: 1965-08-14, 53 y.o.   MRN: [id number]  HPI CPE- UTD on colonoscopy, Tdap.  Pt is moving to [place]   Review of Systems Patient reports no vision/hearing changes, anorexia, fever ,adenopathy, persistant/recurrent hoarseness, swallowing issues, chest pain, palpitations, edema, persistant/recurrent cough, hemoptysis, dyspnea (rest,exertional, paroxysmal nocturnal), gastrointestinal  bleeding (melena, rectal bleeding), abdominal pain, excessive heart burn, GU symptoms (dysuria, hematuria, voiding/incontinence issues) syncope, focal weakness, memory loss, numbness & tingling, skin/hair/nail changes, depression, abnormal bruising/bleeding  L lateral epicondylitis- sxs started 3-4 weeks ago.  Pt does a lot of repetitive motion at work.  Hx of similar when pt was lifting weights.  + anxiety- pt reports this has been 'for a little bit'.  Interferes w/ sleep.  Leads to feeling overwhelmed.  Previously on Fluoxetine w/ good results.    Objective:   Physical Exam BP 118/78   Pulse 78   Temp 98.1 F (36.7 C) (Tympanic)   Resp 16   Ht 5\' 6"  (1.676 m)   Wt 189 lb 6 oz (85.9 kg)   SpO2 98%   BMI 30.57 kg/m   General Appearance:    Alert, cooperative, no distress, appears stated age  Head:    Normocephalic, without obvious abnormality, atraumatic  Eyes:    PERRL, conjunctiva/corneas clear, EOM's intact, fundi    benign, both eyes       Ears:    Normal TM's and external ear canals, both ears  Nose:   Nares normal, septum midline, mucosa normal, no drainage   or sinus tenderness  Throat:   Lips, mucosa, and tongue normal; teeth and gums normal  Neck:   Supple, symmetrical, trachea midline, no adenopathy;       thyroid:  No enlargement/tenderness/nodules  Back:     Symmetric, no curvature, ROM normal, no CVA tenderness  Lungs:     Clear to auscultation bilaterally, respirations unlabored  Chest wall:    No tenderness or deformity  Heart:     Regular rate and rhythm, S1 and S2 normal, no murmur, rub   or gallop  Abdomen:     Soft, non-tender, bowel sounds active all four quadrants,    no masses, no organomegaly  Genitalia:    Normal male without lesion, discharge or tenderness  Rectal:    Normal tone, normal prostate, no masses or tenderness  Extremities:   TTP over L lateral  epicondyle  Pulses:   2+ and symmetric all extremities  Skin:   Skin color, texture, turgor normal, no rashes or lesions  Lymph nodes:   Cervical, supraclavicular, and axillary nodes normal  Neurologic:   CNII-XII intact. 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  Reviewed dx and tx plan w/ pt.  Start scheduled NSAIDs, ice, brace.  If no improvement will need referral.  Pt expressed understanding and is in agreement w/ plan.

## 2019-01-31 NOTE — Assessment & Plan Note (Signed)
Pt has lost ~13 lbs since last visit.  Applauded his efforts on diet and exercise.  Check labs to risk stratify.  Will follow.

## 2019-01-31 NOTE — Assessment & Plan Note (Signed)
Deteriorated.  Pt is overwhelmed w/ move to [place], daughter's upcoming wedding, and COVID.  He is interested in starting medication to improve anxiety.  Was previously on Prozac w/o difficulty and had improvement.  Start Prozac and monitor closely for improvement.

## 2019-01-31 NOTE — Patient Instructions (Addendum)
Follow up in 1 month to recheck anxiety We'll notify you of your lab results and make any changes if needed Start Fluoxetine once daily to improve anxiety Start once daily Meloxicam (take w/ food) for the tennis elbow, wear a strap for support, ICE Call with any questions or concerns Hang in there! Stay Safe!!!

## 2019-01-31 NOTE — Assessment & Plan Note (Signed)
Pt's PE WNL.  UTD on colonoscopy, immunizations.  Check labs.  Anticipatory guidance provided.  

## 2019-02-01 LAB — CBC WITH DIFFERENTIAL/PLATELET
Basophils Absolute: 0 10*3/uL (ref 0.0–0.1)
Basophils Relative: 0.3 % (ref 0.0–3.0)
Eosinophils Absolute: 0.1 10*3/uL (ref 0.0–0.7)
Eosinophils Relative: 1.5 % (ref 0.0–5.0)
HCT: 46.7 % (ref 39.0–52.0)
Hemoglobin: 15.5 g/dL (ref 13.0–17.0)
Lymphocytes Relative: 46.5 % — ABNORMAL HIGH (ref 12.0–46.0)
Lymphs Abs: 2.5 10*3/uL (ref 0.7–4.0)
MCHC: 33.2 g/dL (ref 30.0–36.0)
MCV: 92.9 fl (ref 78.0–100.0)
Monocytes Absolute: 0.4 10*3/uL (ref 0.1–1.0)
Monocytes Relative: 8.1 % (ref 3.0–12.0)
Neutro Abs: 2.4 10*3/uL (ref 1.4–7.7)
Neutrophils Relative %: 43.6 % (ref 43.0–77.0)
Platelets: 228 10*3/uL (ref 150.0–400.0)
RBC: 5.03 Mil/uL (ref 4.22–5.81)
RDW: 13.2 % (ref 11.5–15.5)
WBC: 5.4 10*3/uL (ref 4.0–10.5)

## 2019-02-01 LAB — HEPATIC FUNCTION PANEL
ALT: 21 U/L (ref 0–53)
AST: 18 U/L (ref 0–37)
Albumin: 4.4 g/dL (ref 3.5–5.2)
Alkaline Phosphatase: 62 U/L (ref 39–117)
Bilirubin, Direct: 0.1 mg/dL (ref 0.0–0.3)
Total Bilirubin: 0.4 mg/dL (ref 0.2–1.2)
Total Protein: 6.8 g/dL (ref 6.0–8.3)

## 2019-02-01 LAB — BASIC METABOLIC PANEL
BUN: 23 mg/dL (ref 6–23)
CO2: 28 mEq/L (ref 19–32)
Calcium: 9.6 mg/dL (ref 8.4–10.5)
Chloride: 104 mEq/L (ref 96–112)
Creatinine, Ser: 1.1 mg/dL (ref 0.40–1.50)
GFR: 84.66 mL/min (ref >60.00)
Glucose, Bld: 84 mg/dL (ref 70–99)
Potassium: 4.2 mEq/L (ref 3.5–5.1)
Sodium: 139 mEq/L (ref 135–145)

## 2019-02-01 LAB — LIPID PANEL
Cholesterol: 175 mg/dL (ref 0–200)
HDL: 68.6 mg/dL (ref >39.00)
LDL Cholesterol: 79 mg/dL (ref 0–99)
NonHDL: 106.16
Total CHOL/HDL Ratio: 3
Triglycerides: 137 mg/dL (ref 0.0–149.0)
VLDL: 27.4 mg/dL (ref 0.0–40.0)

## 2019-02-01 LAB — PSA: PSA: 0.38 ng/mL (ref 0.10–4.00)

## 2019-02-01 LAB — TSH: TSH: 1.23 u[IU]/mL (ref 0.35–4.50)

## 2019-02-02 ENCOUNTER — Encounter: Payer: Self-pay | Admitting: General Practice

## 2019-02-28 ENCOUNTER — Other Ambulatory Visit: Payer: Self-pay | Admitting: Family Medicine

## 2019-03-02 ENCOUNTER — Encounter: Payer: Self-pay | Admitting: Family Medicine

## 2019-03-02 ENCOUNTER — Ambulatory Visit (INDEPENDENT_AMBULATORY_CARE_PROVIDER_SITE_OTHER): Payer: 59 | Admitting: Family Medicine

## 2019-03-02 ENCOUNTER — Other Ambulatory Visit: Payer: Self-pay

## 2019-03-02 VITALS — Ht 67.0 in | Wt 186.0 lb

## 2019-03-02 DIAGNOSIS — F411 Generalized anxiety disorder: Secondary | ICD-10-CM

## 2019-03-02 NOTE — Progress Notes (Signed)
I have discussed the procedure for the virtual visit with the patient who has given consent to proceed with assessment and treatment.   Pt unable to obtain vitals.   Maliya Marich L Janin Kozlowski, CMA     

## 2019-03-02 NOTE — Progress Notes (Signed)
   Virtual Visit via Video   I connected with patient on 03/02/19 at  4:00 PM EDT by a video enabled telemedicine application and verified that I am speaking with the correct person using two identifiers.  Location patient: Home Location provider: Acup[facility]urist, Office Persons participating in the virtual visit: Patient, Provider, [place] (Jess B)  I discussed the limitations of evaluation and management by telemedicine and the availability of in person appointments. The patient expressed understanding and agreed to proceed.  Subjective:   HPI:   Anxiety- pt was started on Fluoxetine 20mg  at last visit.  Pt feels anxiety is somewhat better.  He is moved into his new place.  Has daughter's wedding upcoming and now caring for MIL.  Pt is also working mandatory overtime.    ROS:   See pertinent positives and negatives per HPI.  Patient Active Problem List   Diagnosis Date Noted  . Obesity (BMI 30-39.9) 01/31/2019  . Physical exam 12/12/2014  . Anxiety state 10/19/2013  . Chest pain 10/19/2013  . Obstructive sleep apnea 12/06/2012  . Seasonal and perennial allergic rhinitis 04/30/2010  . PARESTHESIA 04/30/2010    Social History   Tobacco Use  . Smoking status: Current Some Day Smoker    Packs/day: 0.00    Types: Cigars  . Smokeless tobacco: Never Used  . Tobacco comment: Pt occassionally will smoke a cigar.  Substance Use Topics  . Alcohol use: Yes    Comment: social    Current Outpatient Medications:  .  FLUoxetine (PROZAC) 20 MG capsule, Take 1 capsule (20 mg total) by mouth daily., Disp: 30 capsule, Rfl: 3 .  meloxicam (MOBIC) 15 MG tablet, TAKE 1 TABLET(15 MG) BY MOUTH DAILY, Disp: 30 tablet, Rfl: 0 .  Multiple Vitamin (MULTIVITAMIN) capsule, Take 1 capsule by mouth daily., Disp: , Rfl:  .  UNABLE TO FIND, Med Name:Prework out powder., Disp: , Rfl:   Allergies  Allergen Reactions  . Shellfish Allergy Hives, Swelling and Rash    Objective:   Ht 5\' 7"  (1.702  m)   Wt 186 lb (84.4 kg)   BMI 29.13 kg/m  AAOx3, NAD NCAT, EOMI No obvious CN deficits Coloring WNL Pt is able to speak clearly, coherently without shortness of breath or increased work of breathing.  Thought process is linear.  Mood is appropriate.   Assessment and Plan:   Anxiety- improving.  Pt is still under quite a bit of stress but seems to be handling it better than before.  He is not interested in increasing medication at this time so we will continue current dose.  Pt is to notify me if things change or worsen.  Pt expressed understanding and is in agreement w/ plan.    Annye Asa, MD 03/02/2019

## 2019-09-27 ENCOUNTER — Emergency Department (HOSPITAL_BASED_OUTPATIENT_CLINIC_OR_DEPARTMENT_OTHER): Payer: 59

## 2019-09-27 ENCOUNTER — Encounter (HOSPITAL_BASED_OUTPATIENT_CLINIC_OR_DEPARTMENT_OTHER): Payer: Self-pay

## 2019-09-27 ENCOUNTER — Other Ambulatory Visit: Payer: Self-pay

## 2019-09-27 ENCOUNTER — Emergency Department (HOSPITAL_BASED_OUTPATIENT_CLINIC_OR_DEPARTMENT_OTHER)
Admission: EM | Admit: 2019-09-27 | Discharge: 2019-09-27 | Disposition: A | Discharge status: Home / Self Care | Payer: 59 | Attending: Emergency Medicine | Admitting: Emergency Medicine

## 2019-09-27 DIAGNOSIS — Z79899 Other long term (current) drug therapy: Secondary | ICD-10-CM | POA: Diagnosis not present

## 2019-09-27 DIAGNOSIS — F17290 Nicotine dependence, other tobacco product, uncomplicated: Secondary | ICD-10-CM | POA: Insufficient documentation

## 2019-09-27 DIAGNOSIS — R0789 Other chest pain: Secondary | ICD-10-CM | POA: Insufficient documentation

## 2019-09-27 DIAGNOSIS — Z91013 Allergy to seafood: Secondary | ICD-10-CM | POA: Diagnosis not present

## 2019-09-27 DIAGNOSIS — R079 Chest pain, unspecified: Secondary | ICD-10-CM

## 2019-09-27 LAB — BASIC METABOLIC PANEL
Anion gap: 9 (ref 5–15)
BUN: 15 mg/dL (ref 6–20)
CO2: 25 mmol/L (ref 22–32)
Calcium: 8.6 mg/dL — ABNORMAL LOW (ref 8.9–10.3)
Chloride: 104 mmol/L (ref 98–111)
Creatinine, Ser: 0.96 mg/dL (ref 0.61–1.24)
GFR calc Af Amer: >60 mL/min (ref >60)
GFR calc non Af Amer: >60 mL/min (ref >60)
Glucose, Bld: 157 mg/dL — ABNORMAL HIGH (ref 70–99)
Potassium: 3.5 mmol/L (ref 3.5–5.1)
Sodium: 138 mmol/L (ref 135–145)

## 2019-09-27 LAB — HEPATIC FUNCTION PANEL
ALT: 28 U/L (ref 0–44)
AST: 25 U/L (ref 15–41)
Albumin: 3.8 g/dL (ref 3.5–5.0)
Alkaline Phosphatase: 48 U/L (ref 38–126)
Bilirubin, Direct: 0.2 mg/dL (ref 0.0–0.2)
Indirect Bilirubin: 0.6 mg/dL (ref 0.3–0.9)
Total Bilirubin: 0.8 mg/dL (ref 0.3–1.2)
Total Protein: 6.6 g/dL (ref 6.5–8.1)

## 2019-09-27 LAB — CBC WITH DIFFERENTIAL/PLATELET
Abs Immature Granulocytes: 0.02 10*3/uL (ref 0.00–0.07)
Basophils Absolute: 0 10*3/uL (ref 0.0–0.1)
Basophils Relative: 0 %
Eosinophils Absolute: 0.1 10*3/uL (ref 0.0–0.5)
Eosinophils Relative: 1 %
HCT: 45.2 % (ref 39.0–52.0)
Hemoglobin: 15 g/dL (ref 13.0–17.0)
Immature Granulocytes: 0 %
Lymphocytes Relative: 41 %
Lymphs Abs: 2.3 10*3/uL (ref 0.7–4.0)
MCH: 30.7 pg (ref 26.0–34.0)
MCHC: 33.2 g/dL (ref 30.0–36.0)
MCV: 92.6 fL (ref 80.0–100.0)
Monocytes Absolute: 0.4 10*3/uL (ref 0.1–1.0)
Monocytes Relative: 7 %
Neutro Abs: 2.8 10*3/uL (ref 1.7–7.7)
Neutrophils Relative %: 51 %
Platelets: 192 10*3/uL (ref 150–400)
RBC: 4.88 MIL/uL (ref 4.22–5.81)
RDW: 13 % (ref 11.5–15.5)
WBC: 5.7 10*3/uL (ref 4.0–10.5)
nRBC: 0 % (ref 0.0–0.2)

## 2019-09-27 LAB — TROPONIN I (HIGH SENSITIVITY)
Troponin I (High Sensitivity): 12 ng/L (ref <18)
Troponin I (High Sensitivity): 16 ng/L (ref <18)

## 2019-09-27 LAB — LIPASE, BLOOD: Lipase: 23 U/L (ref 11–51)

## 2019-09-27 MED ORDER — LIDOCAINE VISCOUS HCL 2 % MT SOLN
15.0000 mL | Freq: Once | OROMUCOSAL | Status: AC
  Administered 2019-09-27: 15 mL via ORAL
  Filled 2019-09-27: qty 15

## 2019-09-27 MED ORDER — FENTANYL CITRATE (PF) 100 MCG/2ML IJ SOLN
50.0000 ug | Freq: Once | INTRAMUSCULAR | Status: AC
  Administered 2019-09-27: 50 ug via INTRAVENOUS
  Filled 2019-09-27: qty 2

## 2019-09-27 MED ORDER — ALUM & MAG HYDROXIDE-SIMETH 200-200-20 MG/5ML PO SUSP
30.0000 mL | Freq: Once | ORAL | Status: AC
  Administered 2019-09-27: 30 mL via ORAL
  Filled 2019-09-27: qty 30

## 2019-09-27 NOTE — ED Provider Notes (Signed)
[facility] EMERGENCY DEPARTMENT Provider Note   CSN: [id number] Arrival date & time: 09/27/19  1528     History Chief Complaint  Patient presents with  . Chest Pain    Daniel Mccoy is a 54 y.o. male.  The history is provided by the patient.  Chest Pain Pain location:  Epigastric and substernal area Pain quality: aching   Pain radiates to:  Does not radiate Pain severity:  Mild Onset quality:  Gradual Duration:  3 hours Timing:  Constant Progression:  Unchanged Context: eating (maybe after eating mcd[employer]ds) and at rest   Relieved by:  Nothing Worsened by:  Nothing Associated symptoms: abdominal pain   Associated symptoms: no back pain, no cough, no fatigue, no fever, no headache, no nausea, no palpitations, no shortness of breath, no vomiting and no weakness   Risk factors: male sex   Risk factors: no coronary artery disease, no diabetes mellitus, no high cholesterol, no hypertension, no prior DVT/PE and no smoking        Past Medical History:  Diagnosis Date  . Allergic rhinitis   . Allergy   . Anxiety   . Chest pain     Patient Active Problem List   Diagnosis Date Noted  . Obesity (BMI 30-39.9) 01/31/2019  . Physical exam 12/12/2014  . Anxiety state 10/19/2013  . Chest pain 10/19/2013  . Obstructive sleep apnea 12/06/2012  . Seasonal and perennial allergic rhinitis 04/30/2010  . PARESTHESIA 04/30/2010    History reviewed. No pertinent surgical history.     Family History  Problem Relation Age of Onset  . Diabetes Mother   . Heart disease Mother   . Heart disease Maternal Grandmother   . Heart disease Paternal Grandmother   . Colon cancer Neg Hx     Social History   Tobacco Use  . Smoking status: Current Some Day Smoker    Packs/day: 0.00    Types: Cigars  . Smokeless tobacco: Never Used  . Tobacco comment: Pt occassionally will smoke a cigar.  Substance Use Topics  . Alcohol use: Yes    Comment: social  . Drug use: No     Home Medications Prior to Admission medications   Medication Sig Start Date End Date Taking? Authorizing Provider  FLUoxetine (PROZAC) 20 MG capsule Take 1 capsule (20 mg total) by mouth daily. 01/31/19   Midge Minium, MD  meloxicam (MOBIC) 15 MG tablet TAKE 1 TABLET(15 MG) BY MOUTH DAILY 02/28/19   Midge Minium, MD  Multiple Vitamin (MULTIVITAMIN) capsule Take 1 capsule by mouth daily.    [provider]  UNABLE TO FIND Med Name:Prework out powder.    [provider]    Allergies    Shellfish allergy  Review of Systems   Review of Systems  Constitutional: Negative for chills, fatigue and fever.  HENT: Negative for ear pain and sore throat.   Eyes: Negative for pain and visual disturbance.  Respiratory: Negative for cough and shortness of breath.   Cardiovascular: Positive for chest pain. Negative for palpitations.  Gastrointestinal: Positive for abdominal pain. Negative for nausea and vomiting.  Genitourinary: Negative for dysuria and hematuria.  Musculoskeletal: Negative for arthralgias and back pain.  Skin: Negative for color change and rash.  Neurological: Negative for seizures, syncope, weakness and headaches.  All other systems reviewed and are negative.   Physical Exam Updated Vital Signs  ED Triage Vitals  Enc Vitals Group     BP 09/27/19 1536 (!) 142/86  Pulse Rate 09/27/19 1536 (!) 53     Resp 09/27/19 1536 16     Temp 09/27/19 1536 (!) 97.5 F (36.4 C)     Temp Source 09/27/19 1536 Oral     SpO2 09/27/19 1536 100 %     Weight 09/27/19 1537 197 lb 12.8 oz (89.7 kg)     Height 09/27/19 1537 5\' 7"  (1.702 m)     Head Circumference --      Peak Flow --      Pain Score 09/27/19 1537 10     Pain Loc --      Pain Edu? --      Excl. in GC? --     Physical Exam Vitals and nursing note reviewed.  Constitutional:      General: He is not in acute distress.    Appearance: He is well-developed. He is not ill-appearing.  HENT:      Head: Normocephalic and atraumatic.  Eyes:     Extraocular Movements: Extraocular movements intact.     Conjunctiva/sclera: Conjunctivae normal.     Pupils: Pupils are equal, round, and reactive to light.  Cardiovascular:     Rate and Rhythm: Normal rate and regular rhythm.     Pulses:          Radial pulses are 2+ on the right side and 2+ on the left side.     Heart sounds: Normal heart sounds. No murmur.  Pulmonary:     Effort: Pulmonary effort is normal. No respiratory distress.     Breath sounds: Normal breath sounds. No decreased breath sounds, wheezing or rhonchi.  Abdominal:     General: There is no abdominal bruit.     Palpations: Abdomen is soft. There is no hepatomegaly or mass.     Tenderness: There is abdominal tenderness (epigastric region ). There is no rebound.  Musculoskeletal:        General: Normal range of motion.     Cervical back: Normal range of motion and neck supple.     Right lower leg: No edema.     Left lower leg: No edema.  Skin:    General: Skin is warm and dry.     Capillary Refill: Capillary refill takes less than 2 seconds.  Neurological:     General: No focal deficit present.     Mental Status: He is alert.  Psychiatric:        Mood and Affect: Mood normal.     ED Results / Procedures / Treatments   Labs (all labs ordered are listed, but only abnormal results are displayed) Labs Reviewed  BASIC METABOLIC PANEL - Abnormal; Notable for the following components:      Result Value   Glucose, Bld 157 (*)    Calcium 8.6 (*)    All other components within normal limits  CBC WITH DIFFERENTIAL/PLATELET  HEPATIC FUNCTION PANEL  LIPASE, BLOOD  TROPONIN I (HIGH SENSITIVITY)  TROPONIN I (HIGH SENSITIVITY)    EKG EKG Interpretation  Date/Time:  Tuesday September 27 2019 15:35:29 EDT Ventricular Rate:  54 PR Interval:    QRS Duration: 102 QT Interval:  445 QTC Calculation: 422 R Axis:   -16 Text Interpretation: Sinus rhythm Borderline left  axis deviation Confirmed by [id number] [phone]) on 09/27/2019 3:38:40 PM   Radiology DG Chest Portable 1 View  Result Date: 09/27/2019 CLINICAL DATA:  Chest pain EXAM: PORTABLE CHEST 1 VIEW COMPARISON:  None. FINDINGS: Lungs are clear. Heart is upper normal in  size with pulmonary vascularity normal. No adenopathy. No pneumothorax. No bone lesions. IMPRESSION: Lungs clear.  Heart upper normal in size. Electronically Signed   By: Bretta Bang III M.D.   On: 09/27/2019 16:27    Procedures Procedures (including critical care time)  Medications Ordered in ED Medications  fentaNYL (SUBLIMAZE) injection 50 mcg (50 mcg Intravenous Given 09/27/19 1601)  alum & mag hydroxide-simeth (MAALOX/MYLANTA) 200-200-20 MG/5ML suspension 30 mL (30 mLs Oral Given 09/27/19 1657)    And  lidocaine (XYLOCAINE) 2 % viscous mouth solution 15 mL (15 mLs Oral Given 09/27/19 1657)    ED Course  I have reviewed the triage vital signs and the nursing notes.  Pertinent labs & imaging results that were available during my care of the patient were reviewed by me and considered in my medical decision making (see chart for details).    MDM Rules/Calculators/A&P                      Daniel Mccoy is a 54 year old male with no significant medical history who presents to the ED with chest pain/abdominal pain.  Patient with normal vitals.  No fever.  Pain ongoing for the last several hours.  Possibly associated with eating [employer] earlier today.  Patient with no diaphoresis, no nausea, no vomiting.  Pain does not radiate.  Has a heart score of 1.  EKG shows sinus rhythm.  No ischemic changes.  Will evaluate with 2 troponins.  Possibly GI related pain and will check lipase to evaluate for pancreatitis and hepatic function panel.  We will get basic labs as well.  Patient does not smoke, clear breath sounds.  No infectious symptoms otherwise.  Will get chest x-ray.  Patient to be reevaluated.  Will give fentanyl for pain.   Likely will consider GI cocktail.  No significant anemia, electrolyte abnormality, kidney injury.  No leukocytosis, no anemia.  Initial troponin within normal limits.  Patient feeling better after GI cocktail.  Awaiting delta troponin.  Gallbladder and liver enzymes normal.  Lipase normal doubt pancreatitis.  Repeat troponin overall unremarkable.  Patient feeling better after GI cocktail.  Given information to follow-up with cardiology however will for primary care doctor as they can possibly arrange for stress test if needed.  Suspect likely GI source of pain however, patient understands return precautions.  This chart was dictated using voice recognition software.  Despite best efforts to proofread,  errors can occur which can change the documentation meaning.    Final Clinical Impression(s) / ED Diagnoses Final diagnoses:  Chest pain, unspecified type    Rx / DC Orders ED Discharge Orders    None       Virgina Norfolk, DO 09/27/19 2000

## 2019-09-27 NOTE — Discharge Instructions (Addendum)
Follow-up with your primary care doctor.  I have given you the number for cardiology if needed.  Return to the ED if symptoms worsen as discussed.

## 2019-09-27 NOTE — ED Triage Notes (Signed)
Pt c/o sub sternal CP that pt noticed after waking this AM. Denies ShOB, nausea, or diaphoresis. Pt describes pain as dull and non-radiating.

## 2019-10-12 ENCOUNTER — Ambulatory Visit: Payer: 59 | Admitting: Family Medicine

## 2019-10-14 ENCOUNTER — Ambulatory Visit: Payer: 59 | Admitting: Family Medicine

## 2019-10-14 ENCOUNTER — Other Ambulatory Visit: Payer: Self-pay

## 2019-10-14 ENCOUNTER — Telehealth (INDEPENDENT_AMBULATORY_CARE_PROVIDER_SITE_OTHER): Payer: 59 | Admitting: Family Medicine

## 2019-10-14 ENCOUNTER — Encounter: Payer: Self-pay | Admitting: Family Medicine

## 2019-10-14 VITALS — BP 119/69 | HR 64 | Temp 97.7°F | Ht 67.0 in | Wt 191.0 lb

## 2019-10-14 DIAGNOSIS — E663 Overweight: Secondary | ICD-10-CM | POA: Diagnosis not present

## 2019-10-14 DIAGNOSIS — R079 Chest pain, unspecified: Secondary | ICD-10-CM | POA: Diagnosis not present

## 2019-10-14 MED ORDER — OMEPRAZOLE 20 MG PO CPDR: 20.0000 mg | DELAYED_RELEASE_CAPSULE | Freq: Every day | ORAL | 3 refills | Status: DC

## 2019-10-14 NOTE — Progress Notes (Signed)
I have discussed the procedure for the virtual visit with the patient who has given consent to proceed with assessment and treatment.   Tyyne Cliett L Ran Tullis, CMA     

## 2019-10-14 NOTE — Progress Notes (Signed)
   Virtual Visit via Video   I connected with patient on 10/14/19 at  2:30 PM EDT by a video enabled telemedicine application and verified that I am speaking with the correct person using two identifiers.  Location patient: Home Location provider: Astronomer, Office Persons participating in the virtual visit: Patient, Provider, CMA (Jess B)  I discussed the limitations of evaluation and management by telemedicine and the availability of in person appointments. The patient expressed understanding and agreed to proceed.  Subjective:   HPI:   ER f/u- pt went to ER on 3/23 w/ epigastric and chest pain.  Labs and EKG were WNL.  Troponins (-).  Sxs improved w/ GI cocktail.  Sxs started after eating [employer].  Pt reports feeling 'wiped out' over the weekend.  Woke up Monday AM wet w/ sweat.  Returned to full strength on Tuesday.  Has not had night sweats or abdominal pain since.  No CP, SOB.  No pain in arm/shoulder.  Pt admits that diet 'hasn't been good'.  + family hx of CAD  Overweight- BMI is up to 29.91.  Pt has gained another 5 lbs.  Has not been going to the gym due to COVID.  ROS:   See pertinent positives and negatives per HPI.  Patient Active Problem List   Diagnosis Date Noted  . Obesity (BMI 30-39.9) 01/31/2019  . Physical exam 12/12/2014  . Anxiety state 10/19/2013  . Chest pain 10/19/2013  . Obstructive sleep apnea 12/06/2012  . Seasonal and perennial allergic rhinitis 04/30/2010  . PARESTHESIA 04/30/2010    Social History   Tobacco Use  . Smoking status: Current Some Day Smoker    Packs/day: 0.00    Types: Cigars  . Smokeless tobacco: Never Used  . Tobacco comment: Pt occassionally will smoke a cigar.  Substance Use Topics  . Alcohol use: Yes    Comment: social    Current Outpatient Medications:  .  FLUoxetine (PROZAC) 20 MG capsule, Take 1 capsule (20 mg total) by mouth daily., Disp: 30 capsule, Rfl: 3 .  meloxicam (MOBIC) 15 MG tablet, TAKE 1  TABLET(15 MG) BY MOUTH DAILY, Disp: 30 tablet, Rfl: 0 .  Multiple Vitamin (MULTIVITAMIN) capsule, Take 1 capsule by mouth daily., Disp: , Rfl:  .  UNABLE TO FIND, Med Name:Prework out powder., Disp: , Rfl:   Allergies  Allergen Reactions  . Shellfish Allergy Hives, Swelling and Rash    Objective:   BP 119/69   Pulse 64   Temp 97.7 F (36.5 C) (Tympanic)   Ht 5\' 7"  (1.702 m)   Wt 191 lb (86.6 kg)   BMI 29.91 kg/m   AAOx3, NAD NCAT, EOMI No obvious CN deficits Coloring WNL Pt is able to speak clearly, coherently without shortness of breath or increased work of breathing.  Thought process is linear.  Mood is appropriate.   Assessment and Plan:   Chest pain- new.  Reviewed ER records, labs, and EKG.  Based on pt's description, this sounds like GERD or esophageal spasm.  Will start daily PPI and encouraged healthy diet.  Given family hx will get stress test to risk stratify. Will follow.  Overweight- pt admits diet is not good and he has not been to the gym recently due to COVID.  Encouraged healthy diet and regular exercise.  Will follow.   , MD 10/14/2019

## 2019-10-18 ENCOUNTER — Telehealth: Payer: Self-pay | Admitting: *Deleted

## 2019-10-18 NOTE — Telephone Encounter (Signed)
Left message for patient to call and schedule ETT and COVID screening  Ordered by Dr. Beverely Low

## 2019-10-20 ENCOUNTER — Telehealth: Payer: Self-pay | Admitting: *Deleted

## 2019-10-20 NOTE — Telephone Encounter (Signed)
Left message for patient to call and schedule ETT and COVID testing ordered by Dr. Beverely Low

## 2019-10-31 ENCOUNTER — Encounter: Payer: Self-pay | Admitting: *Deleted

## 2019-11-11 ENCOUNTER — Telehealth: Payer: Self-pay

## 2019-11-11 DIAGNOSIS — R079 Chest pain, unspecified: Secondary | ICD-10-CM

## 2019-11-11 NOTE — Telephone Encounter (Signed)
Called and spoke with pt wife. Since orders were canceled. I reordered these today.

## 2019-11-11 NOTE — Addendum Note (Signed)
Addended by: Geannie Risen on: 11/11/2019 09:56 AM   Modules accepted: Orders

## 2019-11-11 NOTE — Telephone Encounter (Signed)
Patient wife calling regarding a referral for a stress test. Patient missed the call from the provider to schedule the appt. Patient wife would like for someone to return her call at [phone].

## 2019-12-08 ENCOUNTER — Telehealth: Payer: Self-pay | Admitting: Family Medicine

## 2019-12-08 DIAGNOSIS — R079 Chest pain, unspecified: Secondary | ICD-10-CM

## 2019-12-08 NOTE — Telephone Encounter (Signed)
New orders placed for [facility].

## 2019-12-08 NOTE — Telephone Encounter (Signed)
Can you please change the order to go to [facility], so we can get this scheduled for the pt.

## 2019-12-23 ENCOUNTER — Telehealth (HOSPITAL_COMMUNITY): Payer: Self-pay

## 2019-12-23 NOTE — Telephone Encounter (Signed)
Encounter complete. 

## 2019-12-24 ENCOUNTER — Other Ambulatory Visit (HOSPITAL_COMMUNITY): Payer: 59

## 2019-12-28 ENCOUNTER — Ambulatory Visit (HOSPITAL_COMMUNITY)
Admission: RE | Admit: 2019-12-28 | Discharge: 2019-12-28 | Disposition: A | Discharge status: Home / Self Care | Payer: 59 | Source: Ambulatory Visit | Attending: Cardiology | Admitting: Cardiology

## 2019-12-28 ENCOUNTER — Other Ambulatory Visit: Payer: Self-pay

## 2019-12-28 DIAGNOSIS — R9431 Abnormal electrocardiogram [ECG] [EKG]: Secondary | ICD-10-CM

## 2019-12-28 DIAGNOSIS — R079 Chest pain, unspecified: Secondary | ICD-10-CM | POA: Diagnosis present

## 2019-12-29 LAB — EXERCISE TOLERANCE TEST
Estimated workload: 15 METS
Exercise duration (min): 12 min
Exercise duration (sec): 56 s
MPHR: 166 {beats}/min
Peak HR: 171 {beats}/min
Percent HR: 103 %
Rest HR: 58 {beats}/min

## 2020-07-17 ENCOUNTER — Encounter: Payer: Self-pay | Admitting: Physician Assistant

## 2020-07-17 ENCOUNTER — Other Ambulatory Visit: Payer: Self-pay

## 2020-07-17 ENCOUNTER — Telehealth (INDEPENDENT_AMBULATORY_CARE_PROVIDER_SITE_OTHER): Payer: 59 | Admitting: Physician Assistant

## 2020-07-17 DIAGNOSIS — K219 Gastro-esophageal reflux disease without esophagitis: Secondary | ICD-10-CM

## 2020-07-17 NOTE — Progress Notes (Signed)
I have discussed the procedure for the virtual visit with the patient who has given consent to proceed with assessment and treatment.   Winferd Wease S Otelia Hettinger, CMA     

## 2020-07-17 NOTE — Progress Notes (Signed)
   Virtual Visit via Video   I connected with patient on 07/17/20 at  2:30 PM EST by a video enabled telemedicine application and verified that I am speaking with the correct person using two identifiers.  Location patient: Home Location provider: [place] [facility], Office Persons participating in the virtual visit: Patient, Provider, CMA (Patina Moore)  I discussed the limitations of evaluation and management by telemedicine and the availability of in person appointments. The patient expressed understanding and agreed to proceed.  Subjective:   HPI:   Patient presents via [facility] today c/o epigastric burning and LUQ pain with bloating and increased gaseousness. Woke up with this 2 days ago after late night eating including slices of chocolate cake and alcohol. Notes he took an OTC Prilosec given to him by his daughter which seemed to help slightly.  Denies any vomiting, bowel/bladder changes.   ROS:   See pertinent positives and nbeen over the next weekegatives per HPI.  Patient Active Problem List   Diagnosis Date Noted  . Overweight (BMI 25.0-29.9) 01/31/2019  . Physical exam 12/12/2014  . Anxiety state 10/19/2013  . Chest pain 10/19/2013  . Obstructive sleep apnea 12/06/2012  . Seasonal and perennial allergic rhinitis 04/30/2010  . PARESTHESIA 04/30/2010    Social History   Tobacco Use  . Smoking status: Current Some Day Smoker    Packs/day: 0.00    Types: Cigars  . Smokeless tobacco: Never Used  . Tobacco comment: Pt occassionally will smoke a cigar.  Substance Use Topics  . Alcohol use: Yes    Comment: social    Current Outpatient Medications:  Marland Kitchen  Multiple Vitamin (MULTIVITAMIN) capsule, Take 1 capsule by mouth daily., Disp: , Rfl:  .  FLUoxetine (PROZAC) 20 MG capsule, Take 1 capsule (20 mg total) by mouth daily. (Patient not taking: Reported on 07/17/2020), Disp: 30 capsule, Rfl: 3 .  omeprazole (PRILOSEC) 20 MG capsule, Take 1 capsule (20 mg total) by  mouth daily. (Patient not taking: Reported on 07/17/2020), Disp: 30 capsule, Rfl: 3  Allergies  Allergen Reactions  . Shellfish Allergy Hives, Swelling and Rash    Objective:   There were no vitals taken for this visit.  Patient is well-developed, well-nourished in no acute distress.  Resting comfortably at home.  Head is normocephalic, atraumatic.  No labored breathing.  Speech is clear and coherent with logical content.  Patient is alert and oriented at baseline.   Assessment and Plan:   1. Gastroesophageal reflux disease without esophagitis With mild gastritis. No more NSAIDs. Avoid late-night eating. GERD diet reviewed. Start Prilosec 20 mg x 2 weeks. Close follow-up scheduled. Patient adamant about lab work due to family history of gallbladder issues. Discussed symptoms and location are consistent for GERD so will continue care for this but will have him come in for CMP. Lab scheduled.    Piedad Climes, PA-C 07/17/2020

## 2020-07-20 ENCOUNTER — Other Ambulatory Visit: Payer: Self-pay | Admitting: Emergency Medicine

## 2020-07-20 ENCOUNTER — Other Ambulatory Visit (INDEPENDENT_AMBULATORY_CARE_PROVIDER_SITE_OTHER): Payer: 59

## 2020-07-20 ENCOUNTER — Other Ambulatory Visit: Payer: Self-pay

## 2020-07-20 ENCOUNTER — Other Ambulatory Visit: Payer: 59

## 2020-07-20 DIAGNOSIS — K219 Gastro-esophageal reflux disease without esophagitis: Secondary | ICD-10-CM

## 2020-07-20 DIAGNOSIS — R748 Abnormal levels of other serum enzymes: Secondary | ICD-10-CM

## 2020-07-20 LAB — COMPREHENSIVE METABOLIC PANEL
ALT: 473 U/L — ABNORMAL HIGH (ref 0–53)
AST: 292 U/L — ABNORMAL HIGH (ref 0–37)
Albumin: 4 g/dL (ref 3.5–5.2)
Alkaline Phosphatase: 377 U/L — ABNORMAL HIGH (ref 39–117)
BUN: 10 mg/dL (ref 6–23)
CO2: 28 mEq/L (ref 19–32)
Calcium: 9.1 mg/dL (ref 8.4–10.5)
Chloride: 101 mEq/L (ref 96–112)
Creatinine, Ser: 1.02 mg/dL (ref 0.40–1.50)
GFR: 83.23 mL/min (ref >60.00)
Glucose, Bld: 131 mg/dL — ABNORMAL HIGH (ref 70–99)
Potassium: 4.4 mEq/L (ref 3.5–5.1)
Sodium: 139 mEq/L (ref 135–145)
Total Bilirubin: 1 mg/dL (ref 0.2–1.2)
Total Protein: 6.6 g/dL (ref 6.0–8.3)

## 2020-07-20 NOTE — Progress Notes (Signed)
Pt her for lab work per Dr. Beverely Low. Sample obtained successfully.

## 2020-07-24 LAB — HEPATITIS PANEL, ACUTE
Hep A IgM: NONREACTIVE
Hep B C IgM: NONREACTIVE
Hepatitis B Surface Ag: NONREACTIVE
Hepatitis C Ab: NONREACTIVE
SIGNAL TO CUT-OFF: 0.01 (ref <1.00)

## 2020-08-13 ENCOUNTER — Ambulatory Visit
Admission: RE | Admit: 2020-08-13 | Discharge: 2020-08-13 | Disposition: A | Discharge status: Home / Self Care | Payer: 59 | Source: Ambulatory Visit | Attending: Physician Assistant | Admitting: Physician Assistant

## 2020-08-13 DIAGNOSIS — R748 Abnormal levels of other serum enzymes: Secondary | ICD-10-CM

## 2020-08-14 ENCOUNTER — Other Ambulatory Visit: Payer: Self-pay | Admitting: Emergency Medicine

## 2020-08-14 ENCOUNTER — Other Ambulatory Visit: Payer: Self-pay

## 2020-08-14 ENCOUNTER — Telehealth: Payer: Self-pay

## 2020-08-14 DIAGNOSIS — K8020 Calculus of gallbladder without cholecystitis without obstruction: Secondary | ICD-10-CM

## 2020-08-14 DIAGNOSIS — K8010 Calculus of gallbladder with chronic cholecystitis without obstruction: Secondary | ICD-10-CM

## 2020-08-14 NOTE — Telephone Encounter (Signed)
Patient called and would like to know the steps in care from his abdominal ultrasound.

## 2020-08-14 NOTE — Telephone Encounter (Signed)
Referral was placed and patient was notified.

## 2020-08-14 NOTE — Telephone Encounter (Signed)
He needs a general surgery referral for symptomatic gallstones (cholelithiasis)

## 2020-08-14 NOTE — Telephone Encounter (Signed)
Spoke with patient and wife about ultrasound results and per provider pt needs a referral to general surgery. Referral was placed and pt was notified.

## 2021-01-02 ENCOUNTER — Encounter: Payer: Self-pay | Admitting: *Deleted

## 2021-03-08 ENCOUNTER — Encounter: Payer: Self-pay | Admitting: Registered Nurse

## 2021-03-08 ENCOUNTER — Other Ambulatory Visit: Payer: Self-pay

## 2021-03-08 ENCOUNTER — Ambulatory Visit (INDEPENDENT_AMBULATORY_CARE_PROVIDER_SITE_OTHER): Payer: 59 | Admitting: Registered Nurse

## 2021-03-08 VITALS — BP 124/70 | HR 61 | Temp 98.2°F | Resp 17 | Ht 67.0 in | Wt 188.6 lb

## 2021-03-08 DIAGNOSIS — L989 Disorder of the skin and subcutaneous tissue, unspecified: Secondary | ICD-10-CM | POA: Diagnosis not present

## 2021-03-08 NOTE — Progress Notes (Signed)
Established Patient Office Visit  Subjective:  Patient ID: Daniel Mccoy, male    DOB: 02-13-1966  Age: 55 y.o. MRN: [id number]  CC:  Chief Complaint  Patient presents with   Mass    Pt notes lump on lower back on Lt side, notes no pain, feels it is mobile     HPI Daniel Mccoy presents for lump  Lower left back Has been present for some time, unsure of how long Not acutely painful or changing  Getting to be more notable, he is poking at it more. No drainage or "head" to the lesion No pain in spine, saddle symptoms, or other neuro disturbances.  Past Medical History:  Diagnosis Date   Allergic rhinitis    Allergy    Anxiety    Chest pain     History reviewed. No pertinent surgical history.  Family History  Problem Relation Age of Onset   Diabetes Mother    Heart disease Mother    Heart disease Maternal Grandmother    Heart disease Paternal Grandmother    Colon cancer Neg Hx     Social History   Socioeconomic History   Marital status: Married    Spouse name: Not on file   Number of children: Not on file   Years of education: Not on file   Highest education level: Not on file  Occupational History   Not on file  Tobacco Use   Smoking status: Light Smoker    Packs/day: 0.00    Types: Cigars, Cigarettes   Smokeless tobacco: Never   Tobacco comments:    Pt occassionally will smoke a cigar.  Vaping Use   Vaping Use: Former  Substance and Sexual Activity   Alcohol use: Yes    Comment: social   Drug use: No   Sexual activity: Yes  Other Topics Concern   Not on file  Social History Narrative   Not on file   Social Determinants of Health   Financial Resource Strain: Not on file  Food Insecurity: Not on file  Transportation Needs: Not on file  Physical Activity: Not on file  Stress: Not on file  Social Connections: Not on file  Intimate Partner Violence: Not on file    Outpatient Medications Prior to Visit  Medication Sig Dispense Refill    Multiple Vitamin (MULTIVITAMIN) capsule Take 1 capsule by mouth daily.     No facility-administered medications prior to visit.    Allergies  Allergen Reactions   Shellfish Allergy Hives, Swelling and Rash    ROS Review of Systems  Constitutional: Negative.   HENT: Negative.    Eyes: Negative.   Respiratory: Negative.    Cardiovascular: Negative.   Gastrointestinal: Negative.   Endocrine: Negative.   Genitourinary: Negative.   Musculoskeletal: Negative.   Skin: Negative.        Per hpi   Allergic/Immunologic: Negative.   Neurological: Negative.   Hematological: Negative.   Psychiatric/Behavioral: Negative.    All other systems reviewed and are negative.    Objective:    Physical Exam Constitutional:      General: He is not in acute distress.    Appearance: Normal appearance. He is normal weight. He is not ill-appearing, toxic-appearing or diaphoretic.  Cardiovascular:     Rate and Rhythm: Normal rate and regular rhythm.     Heart sounds: Normal heart sounds. No murmur heard.   No friction rub. No gallop.  Pulmonary:     Effort: Pulmonary effort is normal.  No respiratory distress.     Breath sounds: Normal breath sounds. No stridor. No wheezing, rhonchi or rales.  Chest:     Chest wall: No tenderness.  Skin:    Findings: Lesion (dense subcutaneous lesion on lower left back. mobile and nontender. well circumscribed. suspicious for lipoma or other benign neoplasm.) present.  Neurological:     General: No focal deficit present.     Mental Status: He is alert and oriented to person, place, and time. Mental status is at baseline.  Psychiatric:        Mood and Affect: Mood normal.        Behavior: Behavior normal.        Thought Content: Thought content normal.        Judgment: Judgment normal.    BP 124/70   Pulse 61   Temp 98.2 F (36.8 C) (Temporal)   Resp 17   Ht 5\' 7"  (1.702 m)   Wt 188 lb 9.6 oz (85.5 kg)   SpO2 97%   BMI 29.54 kg/m  Wt Readings from  Last 3 Encounters:  03/08/21 188 lb 9.6 oz (85.5 kg)  10/14/19 191 lb (86.6 kg)  09/27/19 197 lb 12.8 oz (89.7 kg)     Health Maintenance Due  Topic Date Due   INFLUENZA VACCINE  Never done    There are no preventive care reminders to display for this patient.  Lab Results  Component Value Date   TSH 1.23 01/31/2019   Lab Results  Component Value Date   WBC 5.7 09/27/2019   HGB 15.0 09/27/2019   HCT 45.2 09/27/2019   MCV 92.6 09/27/2019   PLT 192 09/27/2019   Lab Results  Component Value Date   NA 139 07/20/2020   K 4.4 07/20/2020   CO2 28 07/20/2020   GLUCOSE 131 (H) 07/20/2020   BUN 10 07/20/2020   CREATININE 1.02 07/20/2020   BILITOT 1.0 07/20/2020   ALKPHOS 377 (H) 07/20/2020   AST 292 (H) 07/20/2020   ALT 473 (H) 07/20/2020   PROT 6.6 07/20/2020   ALBUMIN 4.0 07/20/2020   CALCIUM 9.1 07/20/2020   ANIONGAP 9 09/27/2019   GFR 83.23 07/20/2020   Lab Results  Component Value Date   CHOL 175 01/31/2019   Lab Results  Component Value Date   HDL 68.60 01/31/2019   Lab Results  Component Value Date   LDLCALC 79 01/31/2019   Lab Results  Component Value Date   TRIG 137.0 01/31/2019   Lab Results  Component Value Date   CHOLHDL 3 01/31/2019   No results found for: HGBA1C    Assessment & Plan:   Problem List Items Addressed This Visit   None Visit Diagnoses     Skin lesion of back    -  Primary   Relevant Orders   Ambulatory referral to Dermatology       No orders of the defined types were placed in this encounter.   Follow-up: Return if symptoms worsen or fail to improve.    02/02/2019, NP

## 2021-03-08 NOTE — Patient Instructions (Signed)
Daniel Mccoy -  Appears as benign lump, nothing to worry about. Let's figure out exactly what kind and how we want to get rid of it, though  Likely ultrasound early next week. Maybe CT. I'll be in touch to let you know  After imaging is back we can review results and plan next steps. In the mean time, let me know if it gets larger, changes, or grows teeth and tries to bite you.   Thank you  Rich

## 2021-06-04 ENCOUNTER — Telehealth: Payer: Self-pay | Admitting: Family Medicine

## 2021-06-04 ENCOUNTER — Other Ambulatory Visit: Payer: Self-pay | Admitting: Registered Nurse

## 2021-06-04 NOTE — Telephone Encounter (Signed)
Patient was seen by Gerlene Burdock for a knot on his back and referred to a dermatologist - patient states that the dermatologist said the knot is internal and thinks he would need xrays or an ultrasound.  Please advise.  Please call wife's number:  [phone] Suzette Battiest)

## 2021-06-05 NOTE — Telephone Encounter (Signed)
Patient was seen by Gerlene Burdock for a knot on his back and referred to a dermatologist - patient states that the dermatologist said the knot is internal and thinks he would need xrays or an ultrasound.  Please advise.  Please call wife's number:  [phone] Suzette Battiest)

## 2021-06-18 ENCOUNTER — Other Ambulatory Visit: Payer: Self-pay | Admitting: Registered Nurse

## 2021-06-18 DIAGNOSIS — L989 Disorder of the skin and subcutaneous tissue, unspecified: Secondary | ICD-10-CM

## 2021-08-02 ENCOUNTER — Encounter: Payer: Self-pay | Admitting: Family Medicine

## 2021-08-02 ENCOUNTER — Ambulatory Visit: Payer: 59 | Admitting: Family Medicine

## 2021-08-02 VITALS — BP 122/78 | HR 80 | Temp 98.0°F | Resp 16 | Wt 190.6 lb

## 2021-08-02 DIAGNOSIS — M7989 Other specified soft tissue disorders: Secondary | ICD-10-CM | POA: Diagnosis not present

## 2021-08-02 NOTE — Patient Instructions (Signed)
Follow up as needed or as scheduled We'll call you to schedule the ultrasound and get this checked out I suspect it's a LIPOMA (a harmless fatty lump) Call with any questions or concerns Have a great weekend!!!

## 2021-08-02 NOTE — Progress Notes (Signed)
° °  Subjective:    Patient ID: Daniel Mccoy, male    DOB: 14-Jan-1966, 56 y.o.   MRN: [id number]  HPI Lump on back- pt saw Rich for this back in September.  He was referred to Dermatology who recommended imaging due to the depth.  L lower back.  Not painful but he will 'know it's there' at times.  No redness or drainage or sign of infxn.  Pt feels area is slowly enlarging.   Review of Systems For ROS see HPI   This visit occurred during the SARS-CoV-2 public health emergency.  Safety protocols were in place, including screening questions prior to the visit, additional usage of staff PPE, and extensive cleaning of exam room while observing appropriate contact time as indicated for disinfecting solutions.      Objective:   Physical Exam Vitals reviewed.  Constitutional:      General: He is not in acute distress.    Appearance: Normal appearance. He is not ill-appearing.  HENT:     Head: Normocephalic and atraumatic.  Musculoskeletal:        General: Swelling (~2 cm freely mobile soft tissue mass of L lower back) present. No tenderness or deformity.  Skin:    General: Skin is warm and dry.  Neurological:     General: No focal deficit present.     Mental Status: He is alert and oriented to person, place, and time.  Psychiatric:        Mood and Affect: Mood normal.        Behavior: Behavior normal.          Assessment & Plan:   Soft tissue mass- L lower back.  Freely mobile.  No TTP.  Consistent w/ lipoma but pt is anxious as it has been present for awhile and he feels it is slowly enlarging.  Discussed that if this is a lipoma that's a normal course, but we will get [place] to definitively identify.  Pt expressed understanding and is in agreement w/ plan.

## 2021-08-13 ENCOUNTER — Encounter: Payer: Self-pay | Admitting: Family Medicine

## 2021-08-13 ENCOUNTER — Ambulatory Visit (INDEPENDENT_AMBULATORY_CARE_PROVIDER_SITE_OTHER): Payer: 59 | Admitting: Family Medicine

## 2021-08-13 VITALS — BP 118/82 | HR 56 | Temp 98.4°F | Resp 16 | Ht 66.0 in | Wt 192.8 lb

## 2021-08-13 DIAGNOSIS — Z125 Encounter for screening for malignant neoplasm of prostate: Secondary | ICD-10-CM

## 2021-08-13 DIAGNOSIS — E669 Obesity, unspecified: Secondary | ICD-10-CM | POA: Diagnosis not present

## 2021-08-13 DIAGNOSIS — Z23 Encounter for immunization: Secondary | ICD-10-CM | POA: Diagnosis not present

## 2021-08-13 DIAGNOSIS — Z0000 Encounter for general adult medical examination without abnormal findings: Secondary | ICD-10-CM

## 2021-08-13 DIAGNOSIS — G4700 Insomnia, unspecified: Secondary | ICD-10-CM

## 2021-08-13 LAB — BASIC METABOLIC PANEL
BUN: 16 mg/dL (ref 6–23)
CO2: 31 mEq/L (ref 19–32)
Calcium: 9.8 mg/dL (ref 8.4–10.5)
Chloride: 103 mEq/L (ref 96–112)
Creatinine, Ser: 0.96 mg/dL (ref 0.40–1.50)
GFR: 88.84 mL/min (ref >60.00)
Glucose, Bld: 98 mg/dL (ref 70–99)
Potassium: 3.6 mEq/L (ref 3.5–5.1)
Sodium: 139 mEq/L (ref 135–145)

## 2021-08-13 LAB — HEPATIC FUNCTION PANEL
ALT: 23 U/L (ref 0–53)
AST: 16 U/L (ref 0–37)
Albumin: 4.4 g/dL (ref 3.5–5.2)
Alkaline Phosphatase: 50 U/L (ref 39–117)
Bilirubin, Direct: 0.1 mg/dL (ref 0.0–0.3)
Total Bilirubin: 0.5 mg/dL (ref 0.2–1.2)
Total Protein: 6.9 g/dL (ref 6.0–8.3)

## 2021-08-13 LAB — LIPID PANEL
Cholesterol: 168 mg/dL (ref 0–200)
HDL: 70 mg/dL (ref >39.00)
LDL Cholesterol: 86 mg/dL (ref 0–99)
NonHDL: 98.18
Total CHOL/HDL Ratio: 2
Triglycerides: 61 mg/dL (ref 0.0–149.0)
VLDL: 12.2 mg/dL (ref 0.0–40.0)

## 2021-08-13 LAB — CBC WITH DIFFERENTIAL/PLATELET
Basophils Absolute: 0 10*3/uL (ref 0.0–0.1)
Basophils Relative: 0.5 % (ref 0.0–3.0)
Eosinophils Absolute: 0 10*3/uL (ref 0.0–0.7)
Eosinophils Relative: 0.8 % (ref 0.0–5.0)
HCT: 44 % (ref 39.0–52.0)
Hemoglobin: 14.4 g/dL (ref 13.0–17.0)
Lymphocytes Relative: 48.5 % — ABNORMAL HIGH (ref 12.0–46.0)
Lymphs Abs: 2.6 10*3/uL (ref 0.7–4.0)
MCHC: 32.9 g/dL (ref 30.0–36.0)
MCV: 92.3 fl (ref 78.0–100.0)
Monocytes Absolute: 0.3 10*3/uL (ref 0.1–1.0)
Monocytes Relative: 5.3 % (ref 3.0–12.0)
Neutro Abs: 2.4 10*3/uL (ref 1.4–7.7)
Neutrophils Relative %: 44.9 % (ref 43.0–77.0)
Platelets: 187 10*3/uL (ref 150.0–400.0)
RBC: 4.76 Mil/uL (ref 4.22–5.81)
RDW: 13.2 % (ref 11.5–15.5)
WBC: 5.4 10*3/uL (ref 4.0–10.5)

## 2021-08-13 LAB — PSA: PSA: 0.41 ng/mL (ref 0.10–4.00)

## 2021-08-13 LAB — TSH: TSH: 2.22 u[IU]/mL (ref 0.35–5.50)

## 2021-08-13 MED ORDER — TRAZODONE HCL 50 MG PO TABS: 25.0000 mg | ORAL_TABLET | Freq: Every evening | ORAL | 3 refills | Status: DC | PRN

## 2021-08-13 NOTE — Progress Notes (Signed)
° °  Subjective:    Patient ID: Daniel Mccoy, male    DOB: 09/15/1965, 56 y.o.   MRN: [id number]  HPI CPE- UTD on Tdap, colonoscopy.  Declines flu shot.  Will get Shingrix today.  Health Maintenance  Topic Date Due   Zoster Vaccines- Shingrix (1 of 2) Never done   COVID-19 Vaccine (2 - Booster for [employer] series) 08/18/2021 (Originally 12/10/2019)   INFLUENZA VACCINE  10/04/2021 (Originally 02/04/2021)   HIV Screening  03/08/2022 (Originally 11/17/1980)   TETANUS/TDAP  03/26/2023   COLONOSCOPY (Pts 45-53yrs Insurance coverage will need to be confirmed)  03/03/2027   Hepatitis C Screening  Completed   HPV VACCINES  Aged Out      Review of Systems Patient reports no vision/hearing changes, anorexia, fever ,adenopathy, persistant/recurrent hoarseness, swallowing issues, chest pain, palpitations, edema, persistant/recurrent cough, hemoptysis, dyspnea (rest,exertional, paroxysmal nocturnal), gastrointestinal  bleeding (melena, rectal bleeding), abdominal pain, excessive heart burn, GU symptoms (dysuria, hematuria, voiding/incontinence issues) syncope, focal weakness, memory loss, numbness & tingling, skin/hair/nail changes, depression, anxiety, abnormal bruising/bleeding, musculoskeletal symptoms/signs.   + sleep difficulties- pt reports difficulty falling asleep and staying asleep.  Has not tried OTC products.  Has hx of OSA but not using CPAP due to problems w/ machine.    This visit occurred during the SARS-CoV-2 public health emergency.  Safety protocols were in place, including screening questions prior to the visit, additional usage of staff PPE, and extensive cleaning of exam room while observing appropriate contact time as indicated for disinfecting solutions.      Objective:   Physical Exam General Appearance:    Alert, cooperative, no distress, appears stated age  Head:    Normocephalic, without obvious abnormality, atraumatic  Eyes:    PERRL, conjunctiva/corneas clear, EOM's intact,  fundi    benign, both eyes       Ears:    Normal TM's and external ear canals, both ears  Nose:   Deferred due to COVID  Throat:   Neck:   Supple, symmetrical, trachea midline, no adenopathy;       thyroid:  No enlargement/tenderness/nodules  Back:     Symmetric, no curvature, ROM normal, no CVA tenderness  Lungs:     Clear to auscultation bilaterally, respirations unlabored  Chest wall:    No tenderness or deformity  Heart:    Regular rate and rhythm, S1 and S2 normal, no murmur, rub   or gallop  Abdomen:     Soft, non-tender, bowel sounds active all four quadrants,    no masses, no organomegaly  Genitalia:    deferred  Rectal:    Extremities:   Extremities normal, atraumatic, no cyanosis or edema  Pulses:   2+ and symmetric all extremities  Skin:   Skin color, texture, turgor normal, no rashes or lesions  Lymph nodes:   Cervical, supraclavicular, and axillary nodes normal  Neurologic:   CNII-XII intact. Normal strength, sensation and reflexes      throughout          Assessment & Plan:

## 2021-08-13 NOTE — Addendum Note (Signed)
Addended by: Juliann Pulse on: 08/13/2021 09:43 AM   Modules accepted: Orders

## 2021-08-13 NOTE — Patient Instructions (Signed)
Schedule a nurse visit anytime in the next 6 months for the 2nd shingles shot Follow up in 1 year or as needed We'll notify you of your lab results and make any changes if needed Continue to work on healthy diet and regular exercise- you're doing great! Call with any questions or concerns Stay Safe!  Stay Healthy! Happy Valentine's Day!!

## 2021-08-13 NOTE — Assessment & Plan Note (Signed)
New.  Pt works 3rd shift and is having difficulty both falling asleep and staying asleep.  Will start low dose Trazodone and encouraged him to take it for the first time on a day when he doesn't have to work (weekend) so he knows how it will impact him.  Pt expressed understanding and is in agreement w/ plan.

## 2021-08-13 NOTE — Assessment & Plan Note (Signed)
New.  Pt's BMI now 31.12  Encouraged healthy diet and regular exercise.  Will check labs to risk stratify.

## 2021-08-13 NOTE — Assessment & Plan Note (Signed)
Pt's PE WNL w/ exception of BMI.  UTD on Tdap and colonoscopy.  Shingles shot given.  Check labs.  Anticipatory guidance provided.

## 2021-08-14 ENCOUNTER — Telehealth: Payer: Self-pay

## 2021-08-14 NOTE — Telephone Encounter (Signed)
-----   Message from Sheliah Hatch, MD sent at 08/14/2021  7:20 AM EST ----- Labs look great!  No changes at this time

## 2021-08-14 NOTE — Telephone Encounter (Signed)
Pt aware of labs  

## 2021-08-19 ENCOUNTER — Telehealth: Payer: Self-pay | Admitting: Family Medicine

## 2021-08-19 NOTE — Telephone Encounter (Signed)
Pt called in stating that the [place] is to be of the back not the chest. Can we double check on this pt's appt is tomorrow.   Please advise

## 2021-08-19 NOTE — Telephone Encounter (Signed)
Called patient and explained. He understood

## 2021-08-19 NOTE — Telephone Encounter (Signed)
Unfortunately, there is not an order for [place] of back.  In the past, they have had [place] order [place] chest and then specify location.  This was the closest to the area of concern b/c everything else is extremities or head/neck

## 2021-08-20 ENCOUNTER — Ambulatory Visit (HOSPITAL_BASED_OUTPATIENT_CLINIC_OR_DEPARTMENT_OTHER)
Admission: RE | Admit: 2021-08-20 | Discharge: 2021-08-20 | Disposition: A | Discharge status: Home / Self Care | Payer: 59 | Source: Ambulatory Visit | Attending: Family Medicine | Admitting: Family Medicine

## 2021-08-20 ENCOUNTER — Other Ambulatory Visit: Payer: Self-pay

## 2021-08-20 ENCOUNTER — Other Ambulatory Visit: Payer: Self-pay | Admitting: Family Medicine

## 2021-08-20 ENCOUNTER — Telehealth: Payer: Self-pay

## 2021-08-20 DIAGNOSIS — M7989 Other specified soft tissue disorders: Secondary | ICD-10-CM | POA: Insufficient documentation

## 2021-08-20 NOTE — Telephone Encounter (Signed)
-----   Message from Sheliah Hatch, MD sent at 08/20/2021  3:11 PM EST ----- The ultrasound did not show anything in the area of concern.  My guess is b/c the area in question is a lipoma and appears the same on [place] as the rest of the subcutaneous fat we have.  This is good news

## 2021-08-21 NOTE — Telephone Encounter (Signed)
Patient is aware of results.

## 2021-10-18 ENCOUNTER — Ambulatory Visit (INDEPENDENT_AMBULATORY_CARE_PROVIDER_SITE_OTHER): Payer: 59 | Admitting: Family Medicine

## 2021-10-18 DIAGNOSIS — Z23 Encounter for immunization: Secondary | ICD-10-CM | POA: Diagnosis not present

## 2021-10-18 NOTE — Progress Notes (Signed)
Pt came in today for 2nd dose of Shingrix vaccine series. First dose was 08/13/21 notes had some fatigue after 1st dose ? ?

## 2021-11-02 IMAGING — US US ABDOMEN LIMITED
1 series · 14 of 25 positions shown · non-contrast
Comparison: None.

CLINICAL DATA: Elevated liver enzymes

Severe right upper quadrant pain after eating fatty foods
EXAM:
ULTRASOUND ABDOMEN LIMITED RIGHT UPPER QUADRANT

[Series 1: us abdomen limited · 0.25mm/px · 14 of 46 slices shown]
[im 1/46]
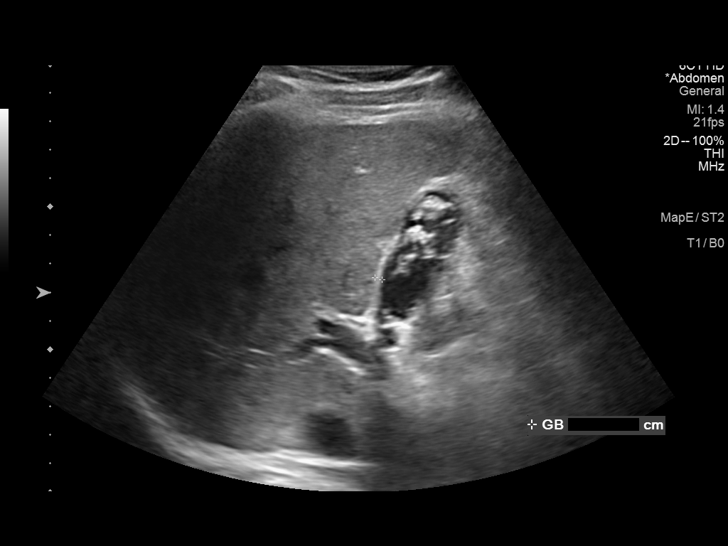
[im 4/46]
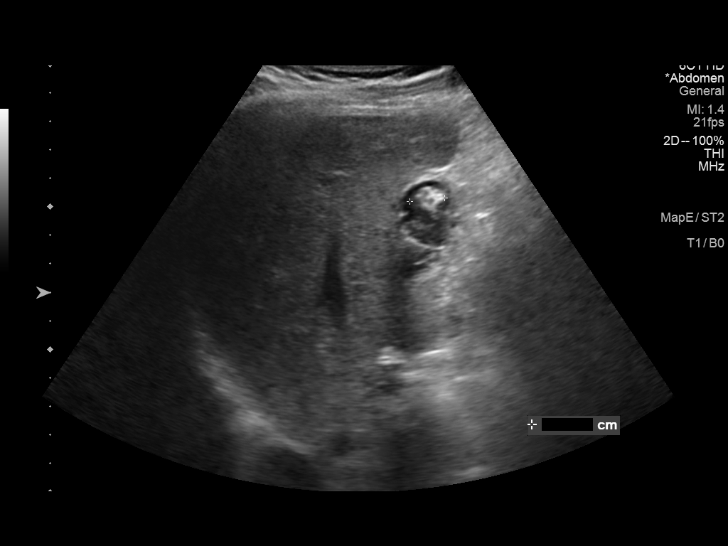
[im 8/46]
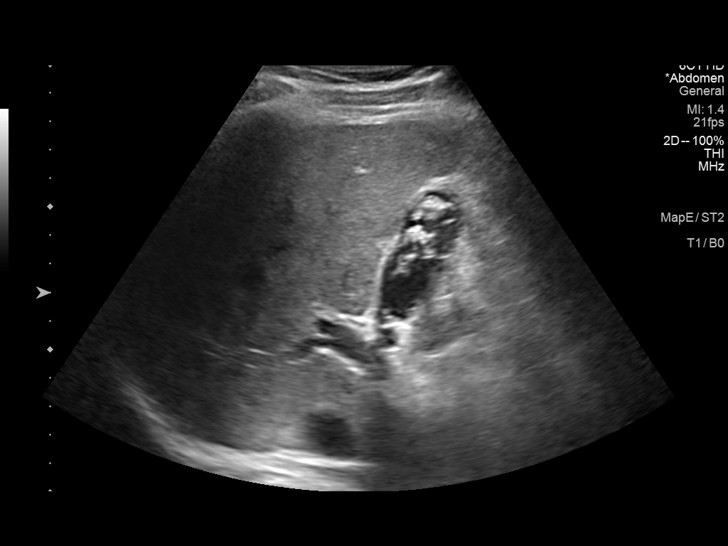
[im 12/46]
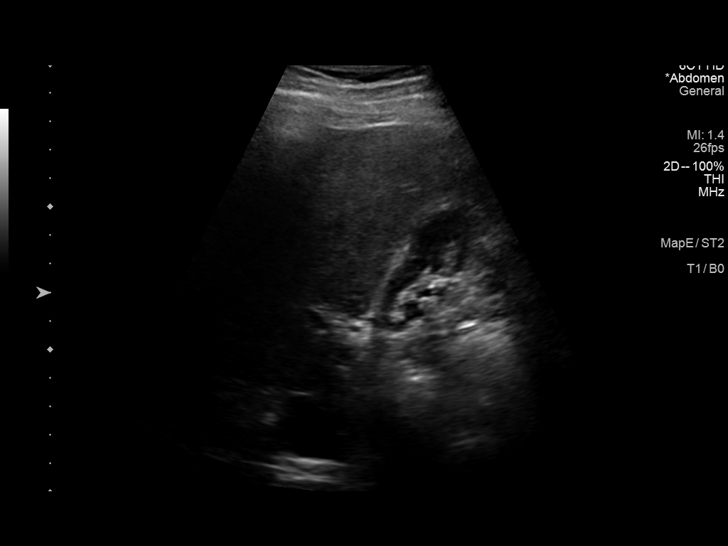
[im 16/46]
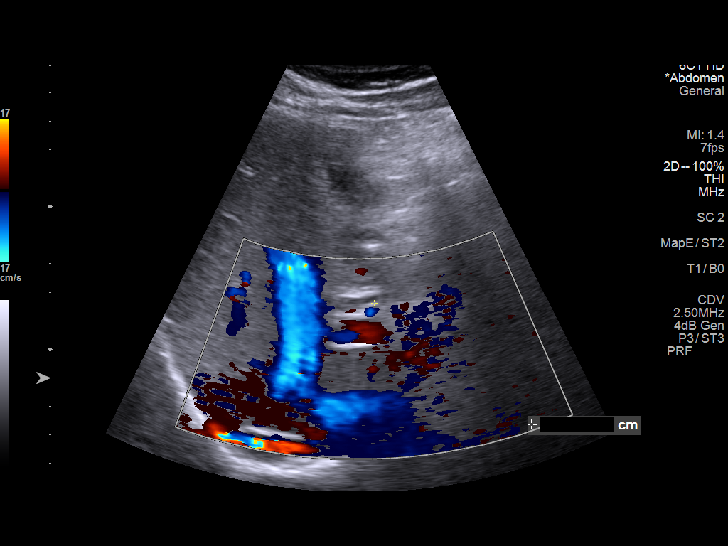
[im 17/46]
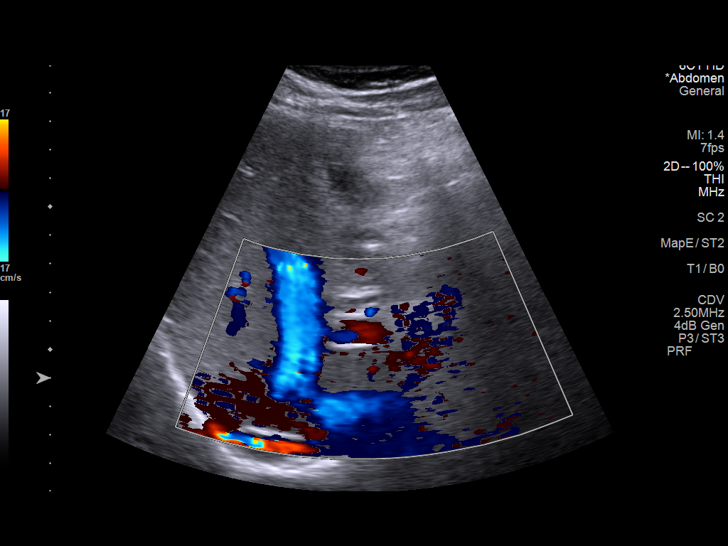
[im 21/46]
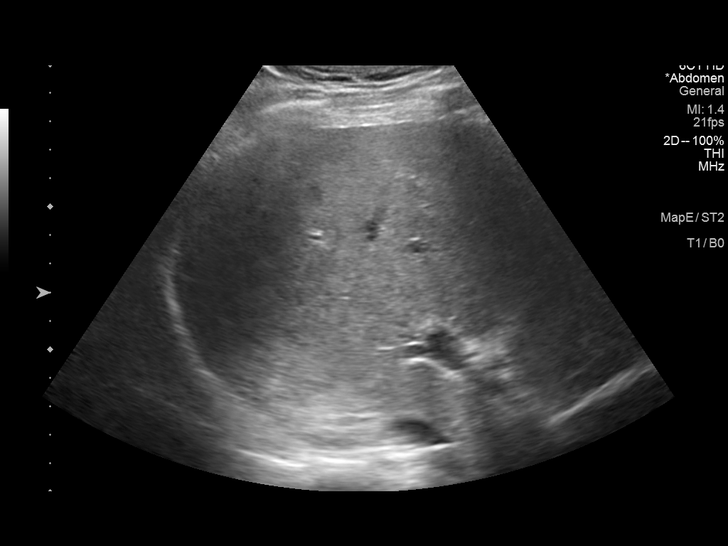
[im 25/46]
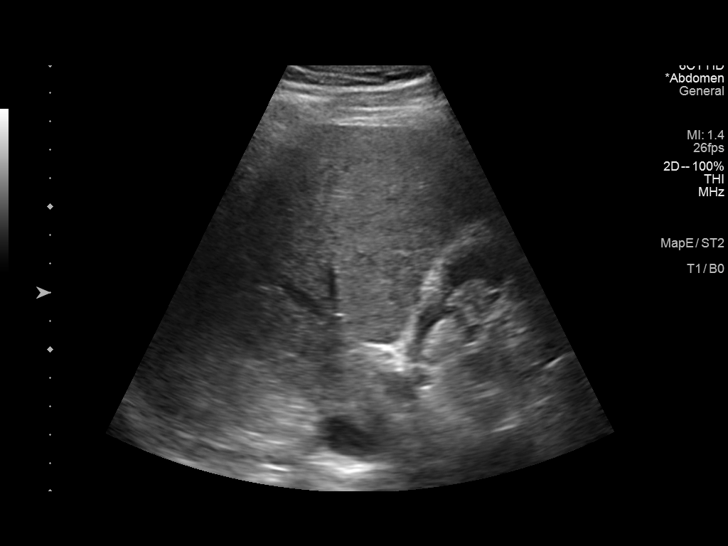
[im 29/46]
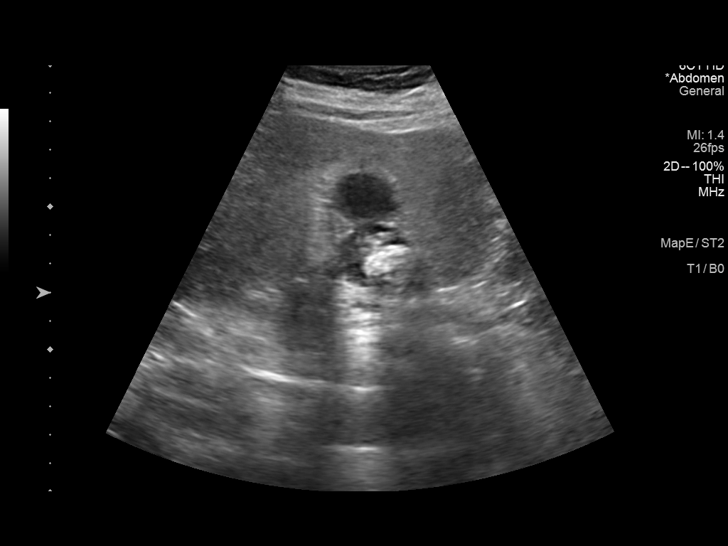
[im 31/46]
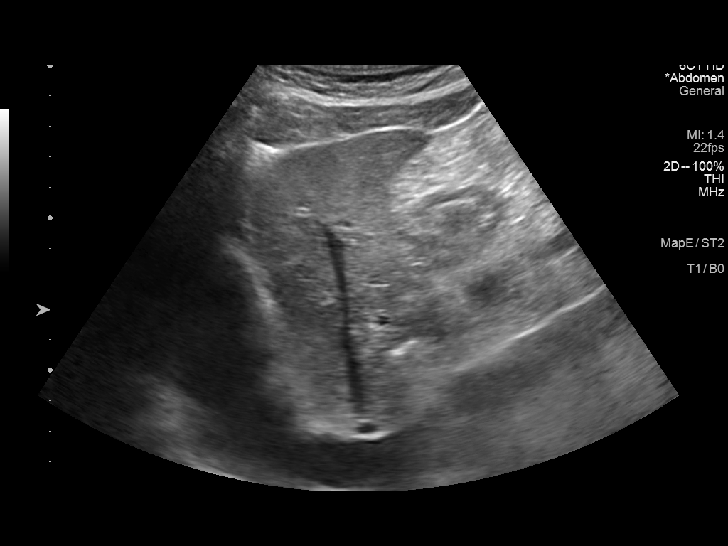
[im 34/46]
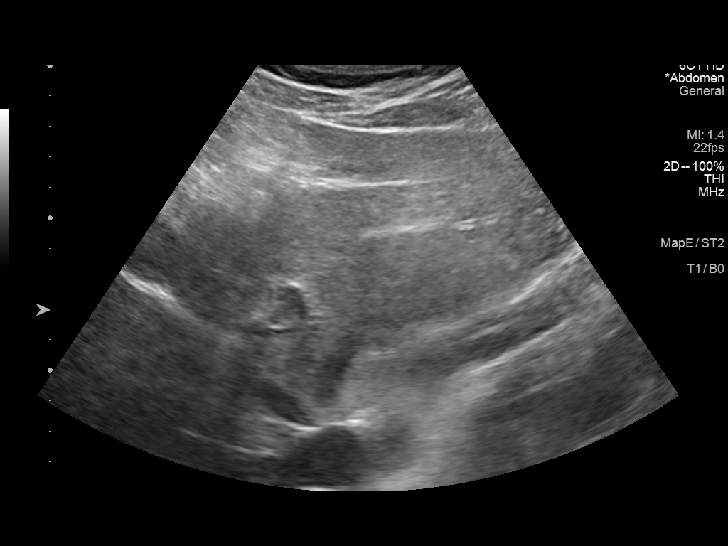
[im 38/46]
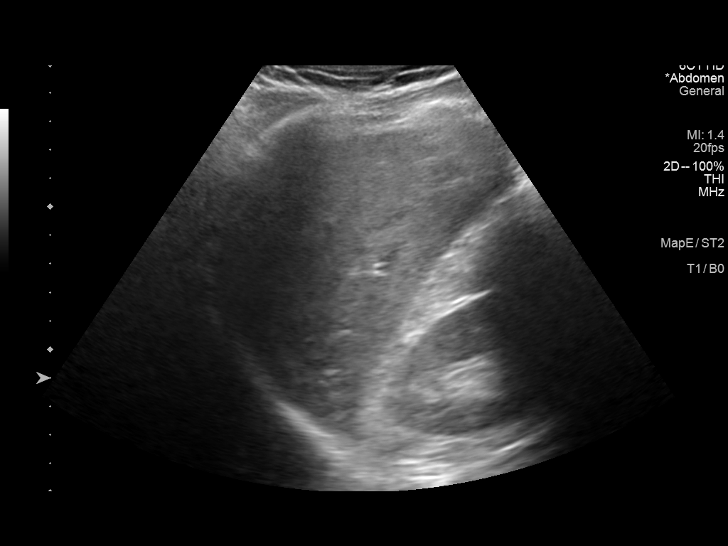
[im 42/46]
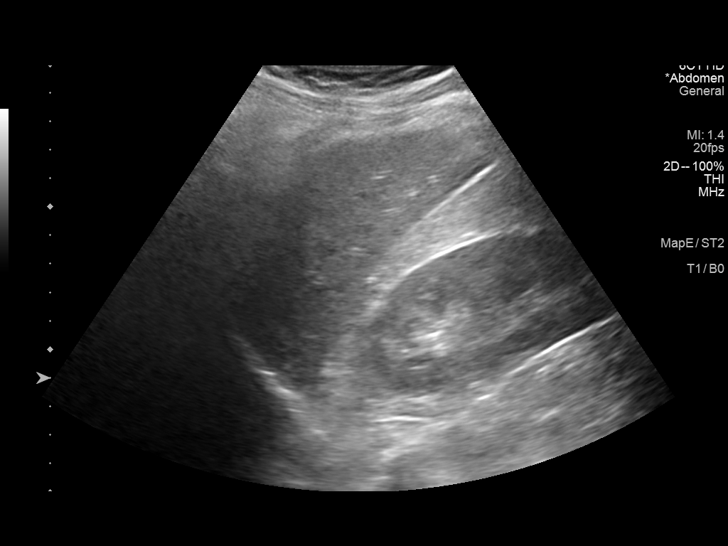
[im 46/46]
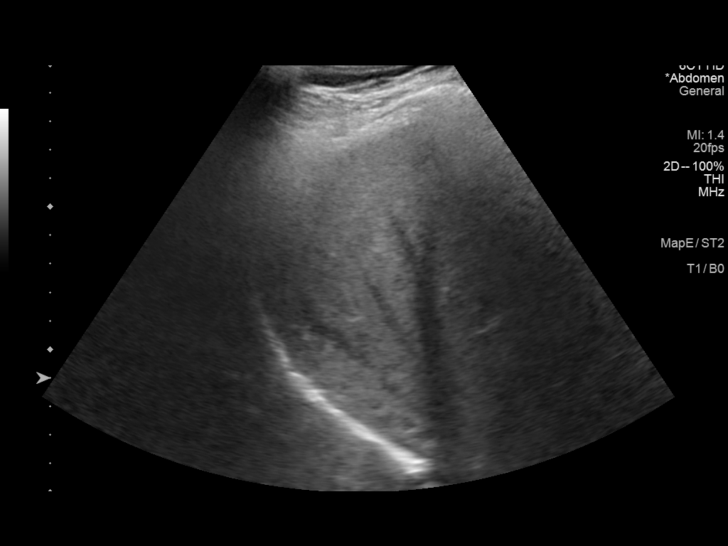

[14 of 25 positions shown; findings below may reference images not displayed]

FINDINGS: Gallbladder:

Cholelithiasis. No gallbladder wall thickening or pericholecystic
fluid. Sonographic Murphy sign is negative per technologist.

Common bile duct:

Diameter: 3.3 mm

Liver:

No focal lesion identified. Within normal limits in parenchymal
echogenicity. Portal vein is patent on color Doppler imaging with
normal direction of blood flow towards the liver.

Other: None.
IMPRESSION: Cholelithiasis without additional sonographic evidence of acute
cholecystitis.

## 2022-05-14 ENCOUNTER — Encounter: Payer: Self-pay | Admitting: Family Medicine

## 2022-05-14 ENCOUNTER — Ambulatory Visit: Payer: 59 | Admitting: Family Medicine

## 2022-05-14 VITALS — BP 124/80 | HR 61 | Temp 98.1°F | Resp 16 | Ht 66.0 in | Wt 191.5 lb

## 2022-05-14 DIAGNOSIS — R29898 Other symptoms and signs involving the musculoskeletal system: Secondary | ICD-10-CM | POA: Diagnosis not present

## 2022-05-14 DIAGNOSIS — M79622 Pain in left upper arm: Secondary | ICD-10-CM | POA: Diagnosis not present

## 2022-05-14 DIAGNOSIS — M79642 Pain in left hand: Secondary | ICD-10-CM | POA: Diagnosis not present

## 2022-05-14 MED ORDER — MELOXICAM 15 MG PO TABS: 15.0000 mg | ORAL_TABLET | Freq: Every day | ORAL | 0 refills | Status: DC

## 2022-05-14 NOTE — Patient Instructions (Signed)
Follow up as needed or as scheduled START the Meloxicam once daily- take w/ food We'll call you with your hand specialist to schedule an appt Call with any questions or concerns Hang in there!!!

## 2022-05-14 NOTE — Progress Notes (Signed)
   Subjective:    Patient ID: Daniel Mccoy, male    DOB: Oct 19, 1965, 56 y.o.   MRN: [id number]  HPI R arm pain- pt reports R upper arm is 'aching'.  Pain is 'off and on'.  Sxs started ~1 week ago.  Pt is lifting ~3x/week.  Denies neck pain.  Pain is worse when lying on it.  Some improvement with Tylenol.  Also having pain in hand- will have episodes of decreased grip strength, numbness.  Pain is on dorsum of hand across metacarpals and described as sharp.  Hand sxs started ~1 weeks ago.  No change in activity level- pt works on machines daily.  Pt is L handed.   Review of Systems For ROS see HPI     Objective:   Physical Exam Vitals reviewed.  Constitutional:      General: He is not in acute distress.    Appearance: Normal appearance. He is not ill-appearing.  HENT:     Head: Normocephalic and atraumatic.  Eyes:     Extraocular Movements: Extraocular movements intact.     Conjunctiva/sclera: Conjunctivae normal.     Pupils: Pupils are equal, round, and reactive to light.  Musculoskeletal:        General: Tenderness (TTP over L tricep) present.     Cervical back: Normal range of motion. No tenderness.     Comments: Full ROM of L shoulder w/o pain  Lymphadenopathy:     Cervical: No cervical adenopathy.  Skin:    General: Skin is warm and dry.     Findings: No bruising, lesion or rash.  Neurological:     General: No focal deficit present.     Mental Status: He is alert and oriented to person, place, and time.     Motor: No weakness (R and L grip symmetric).  Psychiatric:        Mood and Affect: Mood normal.        Behavior: Behavior normal.        Thought Content: Thought content normal.           Assessment & Plan:   L upper arm pain- new.  Given TTP over triceps, suspect this is muscular.  Start once daily NSAID w/ food to improve inflammation and monitor for improvement  L hand pain/grip weakness- new.  Pt's grip is symmetric on exam today but states that the pain in  his hand will 'grab him' when it occurs and has caused him to drop things.  Start Meloxicam as above and refer to hand specialist for complete evaluation as this is pt's dominant hand.  Pt expressed understanding and is in agreement w/ plan.

## 2022-05-27 ENCOUNTER — Ambulatory Visit (INDEPENDENT_AMBULATORY_CARE_PROVIDER_SITE_OTHER): Payer: 59 | Admitting: Orthopaedic Surgery

## 2022-05-27 ENCOUNTER — Ambulatory Visit (INDEPENDENT_AMBULATORY_CARE_PROVIDER_SITE_OTHER): Payer: 59

## 2022-05-27 ENCOUNTER — Ambulatory Visit: Payer: Self-pay

## 2022-05-27 DIAGNOSIS — M79641 Pain in right hand: Secondary | ICD-10-CM

## 2022-05-27 DIAGNOSIS — M79642 Pain in left hand: Secondary | ICD-10-CM | POA: Diagnosis not present

## 2022-05-27 NOTE — Progress Notes (Signed)
Office Visit Note   Patient: Daniel Mccoy           Date of Birth: 1966-01-14           MRN: [id number] Visit Date: 05/27/2022              Requested by: Sheliah Hatch, MD [address] [place],  [place] [address] PCP: Sheliah Hatch, MD   Assessment & Plan: Visit Diagnoses:  1. Pain in both hands     Plan: Impression bilateral hand pain.  Unclear etiology but favor overuse condition.  Recommend relative rest and anti-inflammatories.  Patient should follow-up if symptoms persist otherwise as needed.  Follow-Up Instructions: No follow-ups on file.   Orders:  Orders Placed This Encounter  Procedures   XR Hand Complete Left   XR Hand Complete Right   No orders of the defined types were placed in this encounter.     Procedures: No procedures performed   Clinical Data: No additional findings.   Subjective: Chief Complaint  Patient presents with   Right Hand - Pain   Left Hand - Pain    HPI Daniel Mccoy is a 56 year old gentleman here for evaluation of bilateral hand pain.  He localizes this area to the dorsum of the hand between the second and third metacarpal heads.  He states that it started hurting after he was grabbing something and picking up something a few weeks ago.  Denies any swelling or bruising.  Feels like the hand locks up at times. Review of Systems  Constitutional: Negative.   All other systems reviewed and are negative.    Objective: Vital Signs: There were no vitals taken for this visit.  Physical Exam Vitals and nursing note reviewed.  Constitutional:      Appearance: He is well-developed.  HENT:     Head: Normocephalic and atraumatic.  Eyes:     Pupils: Pupils are equal, round, and reactive to light.  Pulmonary:     Effort: Pulmonary effort is normal.  Abdominal:     Palpations: Abdomen is soft.  Musculoskeletal:        General: Normal range of motion.     Cervical back: Neck supple.  Skin:    General: Skin is warm.   Neurological:     Mental Status: He is alert and oriented to person, place, and time.  Psychiatric:        Behavior: Behavior normal.        Thought Content: Thought content normal.        Judgment: Judgment normal.     Ortho Exam Examination of bilateral hands show no swelling masses or lesions.  Normal skin temperature.  Normal sensation.  He is able to make a full composite fist.  Extensor tendon is centralized over the metacarpal heads. Specialty Comments:  No specialty comments available.  Imaging: XR Hand Complete Right  Result Date: 05/27/2022 No acute or structural abnormalities.  XR Hand Complete Left  Result Date: 05/27/2022 No acute or structural abnormalities    PMFS History: Patient Active Problem List   Diagnosis Date Noted   Insomnia 08/13/2021   Obesity (BMI 30-39.9) 01/31/2019   Physical exam 12/12/2014   Anxiety state 10/19/2013   Chest pain 10/19/2013   Obstructive sleep apnea 12/06/2012   Seasonal and perennial allergic rhinitis 04/30/2010   PARESTHESIA 04/30/2010   Past Medical History:  Diagnosis Date   Allergic rhinitis    Allergy    Anxiety  Chest pain     Family History  Problem Relation Age of Onset   Diabetes Mother    Heart disease Mother    Heart disease Maternal Grandmother    Heart disease Paternal Grandmother    Colon cancer Neg Hx     No past surgical history on file. Social History   Occupational History   Not on file  Tobacco Use   Smoking status: Light Smoker    Packs/day: 0.00    Types: Cigars, Cigarettes   Smokeless tobacco: Never   Tobacco comments:    Pt occassionally will smoke a cigar.  Vaping Use   Vaping Use: Former  Substance and Sexual Activity   Alcohol use: Yes    Comment: social   Drug use: No   Sexual activity: Yes

## 2022-06-10 ENCOUNTER — Other Ambulatory Visit: Payer: Self-pay | Admitting: Lab

## 2022-06-10 ENCOUNTER — Other Ambulatory Visit: Payer: Self-pay | Admitting: Family Medicine

## 2022-06-10 DIAGNOSIS — M79622 Pain in left upper arm: Secondary | ICD-10-CM

## 2022-06-10 MED ORDER — MELOXICAM 15 MG PO TABS: 15.0000 mg | ORAL_TABLET | Freq: Every day | ORAL | 0 refills | Status: DC

## 2022-08-15 ENCOUNTER — Encounter: Payer: Self-pay | Admitting: Family Medicine

## 2022-08-15 ENCOUNTER — Ambulatory Visit (INDEPENDENT_AMBULATORY_CARE_PROVIDER_SITE_OTHER): Payer: 59 | Admitting: Family Medicine

## 2022-08-15 VITALS — BP 124/78 | HR 61 | Temp 98.1°F | Ht 64.75 in | Wt 193.6 lb

## 2022-08-15 DIAGNOSIS — E669 Obesity, unspecified: Secondary | ICD-10-CM

## 2022-08-15 DIAGNOSIS — Z0000 Encounter for general adult medical examination without abnormal findings: Secondary | ICD-10-CM

## 2022-08-15 DIAGNOSIS — Z125 Encounter for screening for malignant neoplasm of prostate: Secondary | ICD-10-CM

## 2022-08-15 LAB — CBC WITH DIFFERENTIAL/PLATELET
Basophils Absolute: 0 10*3/uL (ref 0.0–0.1)
Basophils Relative: 0.5 % (ref 0.0–3.0)
Eosinophils Absolute: 0 10*3/uL (ref 0.0–0.7)
Eosinophils Relative: 0.6 % (ref 0.0–5.0)
HCT: 44.9 % (ref 39.0–52.0)
Hemoglobin: 15 g/dL (ref 13.0–17.0)
Lymphocytes Relative: 47.1 % — ABNORMAL HIGH (ref 12.0–46.0)
Lymphs Abs: 2.2 10*3/uL (ref 0.7–4.0)
MCHC: 33.5 g/dL (ref 30.0–36.0)
MCV: 92.9 fl (ref 78.0–100.0)
Monocytes Absolute: 0.3 10*3/uL (ref 0.1–1.0)
Monocytes Relative: 6.2 % (ref 3.0–12.0)
Neutro Abs: 2.1 10*3/uL (ref 1.4–7.7)
Neutrophils Relative %: 45.6 % (ref 43.0–77.0)
Platelets: 213 10*3/uL (ref 150.0–400.0)
RBC: 4.83 Mil/uL (ref 4.22–5.81)
RDW: 13.6 % (ref 11.5–15.5)
WBC: 4.7 10*3/uL (ref 4.0–10.5)

## 2022-08-15 LAB — LIPID PANEL
Cholesterol: 161 mg/dL (ref 0–200)
HDL: 63.6 mg/dL (ref >39.00)
LDL Cholesterol: 88 mg/dL (ref 0–99)
NonHDL: 97.2
Total CHOL/HDL Ratio: 3
Triglycerides: 47 mg/dL (ref 0.0–149.0)
VLDL: 9.4 mg/dL (ref 0.0–40.0)

## 2022-08-15 LAB — HEPATIC FUNCTION PANEL
ALT: 25 U/L (ref 0–53)
AST: 18 U/L (ref 0–37)
Albumin: 4.3 g/dL (ref 3.5–5.2)
Alkaline Phosphatase: 55 U/L (ref 39–117)
Bilirubin, Direct: 0.1 mg/dL (ref 0.0–0.3)
Total Bilirubin: 0.5 mg/dL (ref 0.2–1.2)
Total Protein: 6.8 g/dL (ref 6.0–8.3)

## 2022-08-15 LAB — BASIC METABOLIC PANEL
BUN: 18 mg/dL (ref 6–23)
CO2: 27 mEq/L (ref 19–32)
Calcium: 9.5 mg/dL (ref 8.4–10.5)
Chloride: 101 mEq/L (ref 96–112)
Creatinine, Ser: 0.97 mg/dL (ref 0.40–1.50)
GFR: 87.13 mL/min (ref >60.00)
Glucose, Bld: 84 mg/dL (ref 70–99)
Potassium: 4.3 mEq/L (ref 3.5–5.1)
Sodium: 138 mEq/L (ref 135–145)

## 2022-08-15 LAB — TSH: TSH: 1.62 u[IU]/mL (ref 0.35–5.50)

## 2022-08-15 LAB — PSA: PSA: 0.46 ng/mL (ref 0.10–4.00)

## 2022-08-15 NOTE — Assessment & Plan Note (Signed)
Ongoing issue.  Weight is stable, BMI 32.47.  Encouraged healthy diet and regular exercise.  Check labs to risk stratify.  Will follow.

## 2022-08-15 NOTE — Patient Instructions (Signed)
Follow up in 1 year or as needed We'll notify you of your lab results and make any changes if needed Continue to work on healthy diet and regular exercise- you're doing great!!! Call with any questions or concerns Stay Safe!  Stay Healthy! Have a great weekend!!!

## 2022-08-15 NOTE — Progress Notes (Signed)
   Subjective:    Patient ID: Daniel Mccoy, male    DOB: [birth year], 57 y.o.   MRN: [id number]  HPI CPE- UTD on Tdap, colonoscopy.  Pt reports feeling good.  Patient Care Team    Relationship Specialty Notifications Start End  Midge Minium, MD PCP - General   06/19/10      Health Maintenance  Topic Date Due   INFLUENZA VACCINE  10/05/2022 (Originally 02/04/2022)   DTaP/Tdap/Td (2 - Td or Tdap) 03/26/2023   COLONOSCOPY (Pts 45-79yrs Insurance coverage will need to be confirmed)  03/03/2027   Hepatitis C Screening  Completed   Zoster Vaccines- Shingrix  Completed   HPV VACCINES  Aged Out   COVID-19 Vaccine  Discontinued   HIV Screening  Discontinued      Review of Systems Patient reports no vision/ hearing changes, adenopathy,fever, weight change,  persistant/recurrent hoarseness , swallowing issues, chest pain, palpitations, edema, persistant/recurrent cough, hemoptysis, dyspnea (rest/exertional/paroxysmal nocturnal), gastrointestinal bleeding (melena, rectal bleeding), abdominal pain, significant heartburn, bowel changes, GU symptoms (dysuria, hematuria, incontinence), Gyn symptoms (abnormal  bleeding, pain),  syncope, focal weakness, memory loss, numbness & tingling, skin/hair/nail changes, abnormal bruising or bleeding, anxiety, or depression.     Objective:   Physical Exam General Appearance:    Alert, cooperative, no distress, appears stated age  Head:    Normocephalic, without obvious abnormality, atraumatic  Eyes:    PERRL, conjunctiva/corneas clear, EOM's intact both eyes       Ears:    Normal TM's and external ear canals, both ears  Nose:   Nares normal, septum midline, mucosa normal, no drainage   or sinus tenderness  Throat:   Lips, mucosa, and tongue normal; teeth and gums normal  Neck:   Supple, symmetrical, trachea midline, no adenopathy;       thyroid:  No enlargement/tenderness/nodules  Back:     Symmetric, no curvature, ROM normal, no CVA tenderness  Lungs:      Clear to auscultation bilaterally, respirations unlabored  Chest wall:    No tenderness or deformity  Heart:    Regular rate and rhythm, S1 and S2 normal, no murmur, rub   or gallop  Abdomen:     Soft, non-tender, bowel sounds active all four quadrants,    no masses, no organomegaly  Genitalia:    deferred  Rectal:    Extremities:   Extremities normal, atraumatic, no cyanosis or edema  Pulses:   2+ and symmetric all extremities  Skin:   Skin color, texture, turgor normal, no rashes or lesions  Lymph nodes:   Cervical, supraclavicular, and axillary nodes normal  Neurologic:   CNII-XII intact. Normal strength, sensation and reflexes      throughout          Assessment & Plan:

## 2022-08-15 NOTE — Assessment & Plan Note (Signed)
Pt's PE WNL w/ exception of BMI.  UTD on Tdap, colonoscopy.  Check labs.  Anticipatory guidance provided.

## 2022-11-09 IMAGING — US US ABDOMEN LIMITED
1 series · 14 of 18 positions shown · non-contrast
Comparison: None.

CLINICAL DATA: Mobile soft tissue mass of lower back for several
months

EXAM:
ULTRASOUND ABDOMEN LIMITED

[Series 1: us chest soft tissue · 18 acquisitions, 14 frames shown]
[im 1/18]
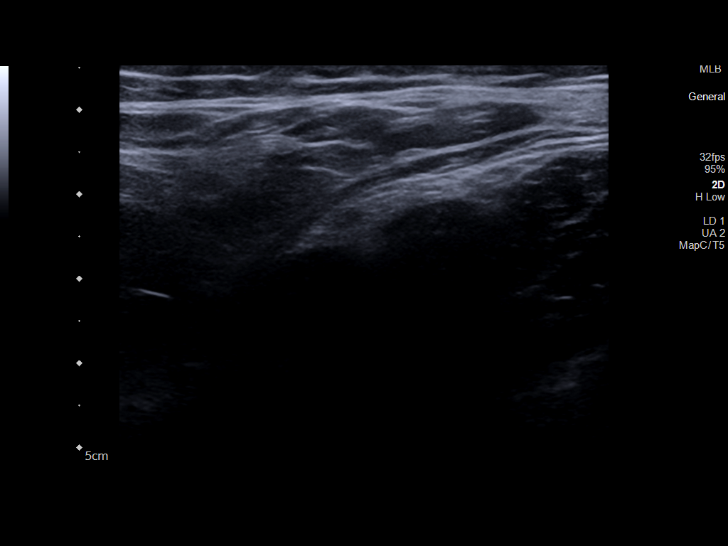
[im 2/18]
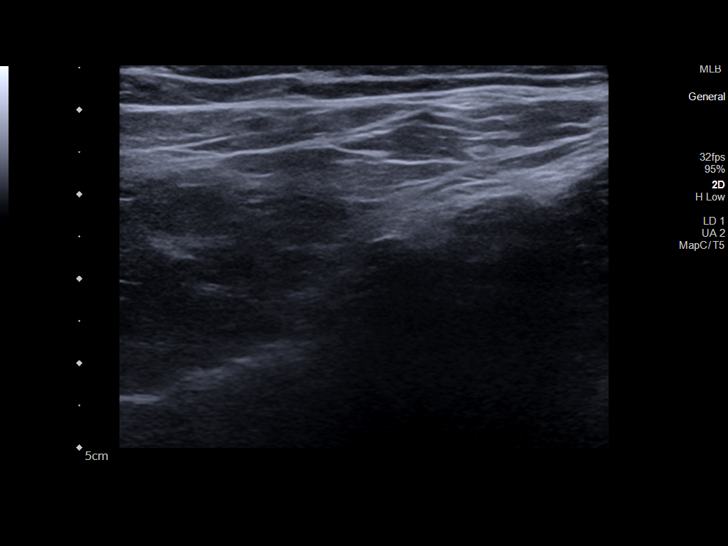
[im 4/18]
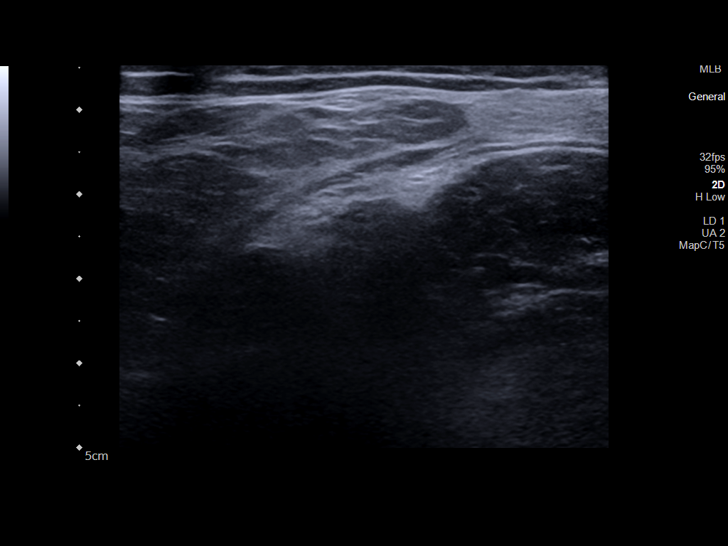
[im 5/18]
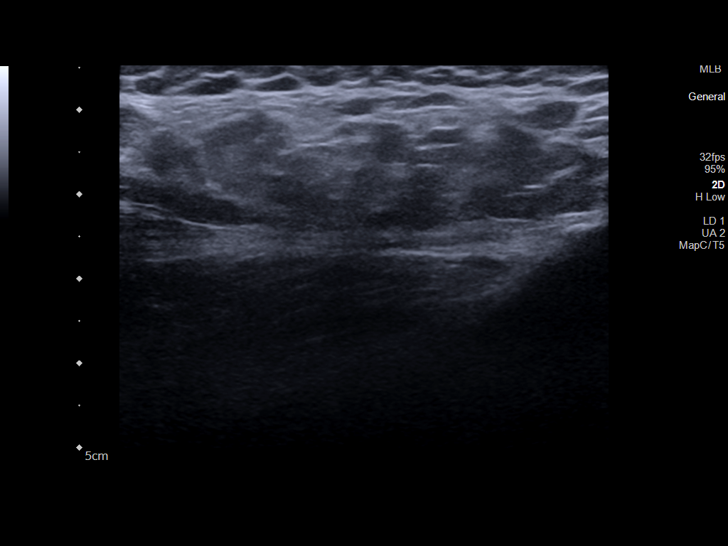
[im 6/18]
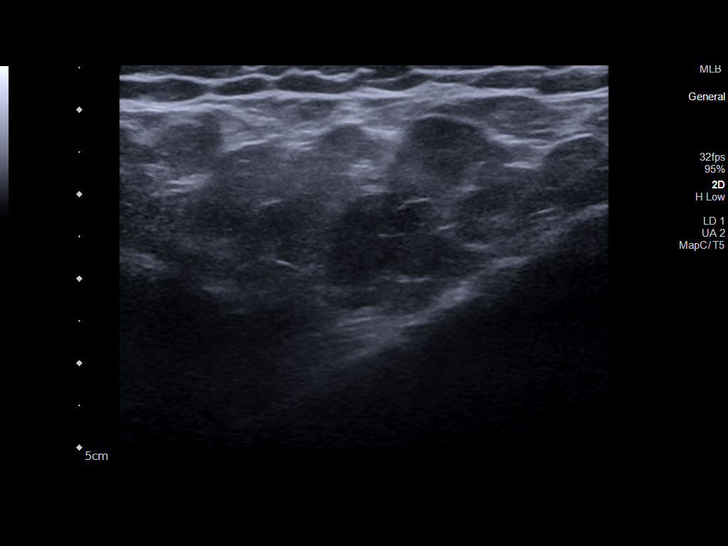
[im 8/18]
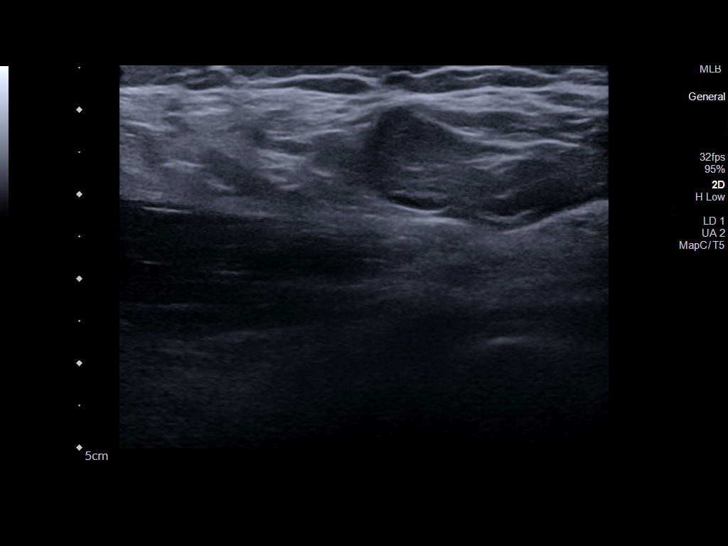
[im 9/18]
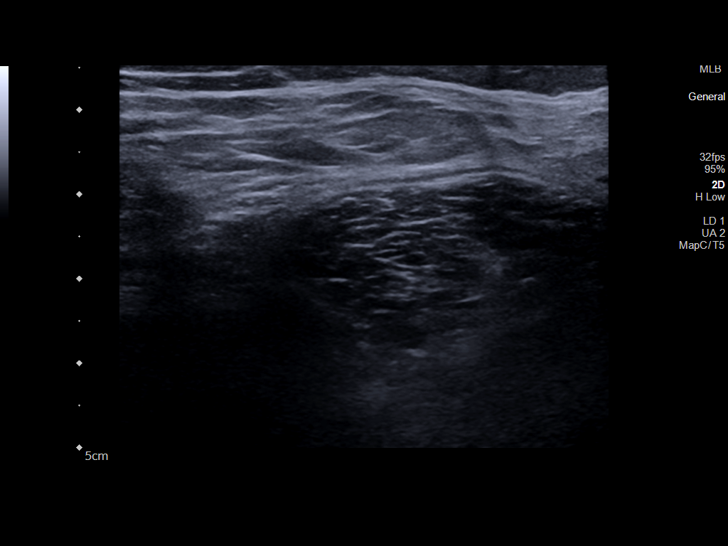
[im 10/18]
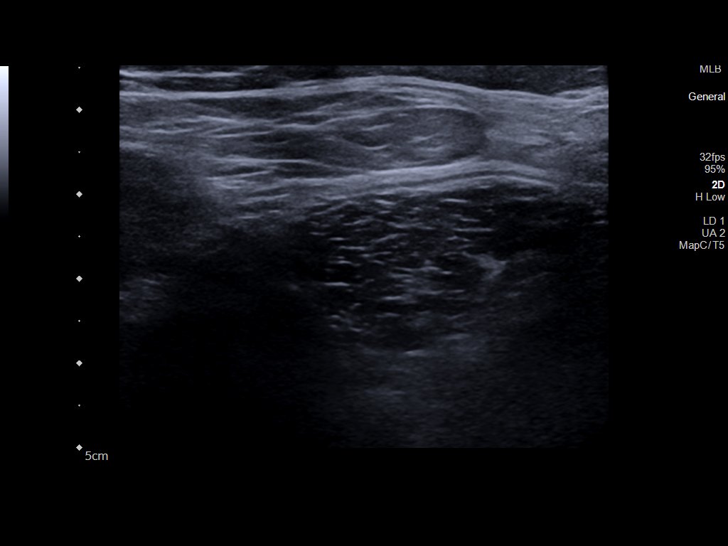
[im 11/18]
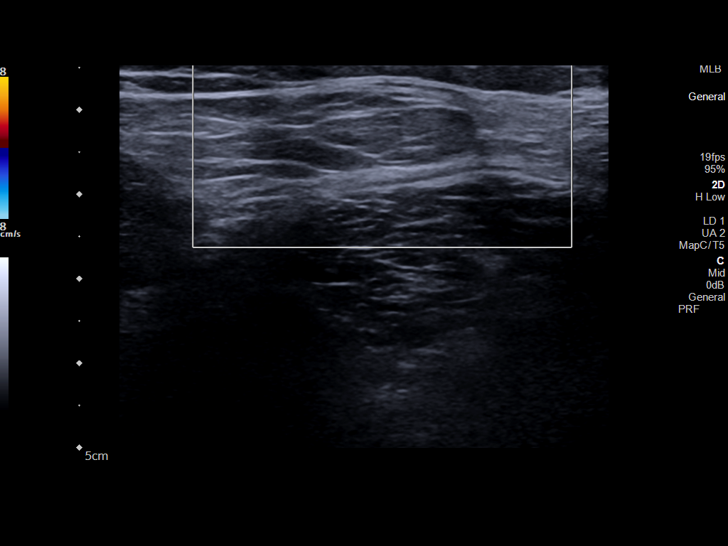
[im 13/18]
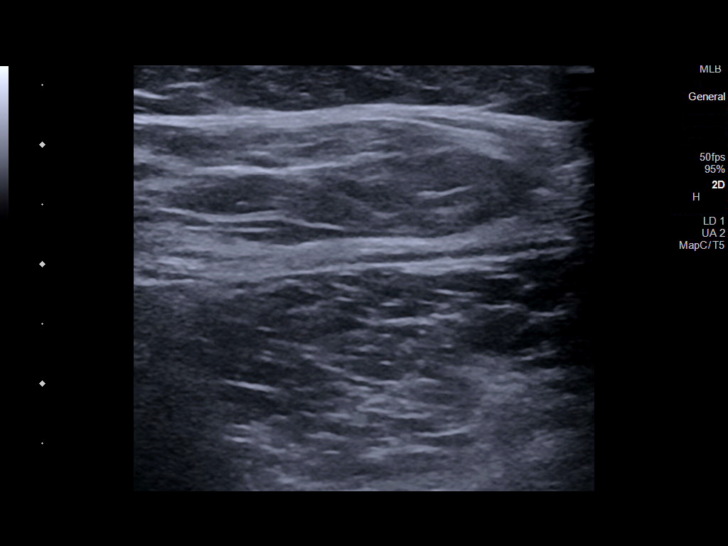
[im 14/18]
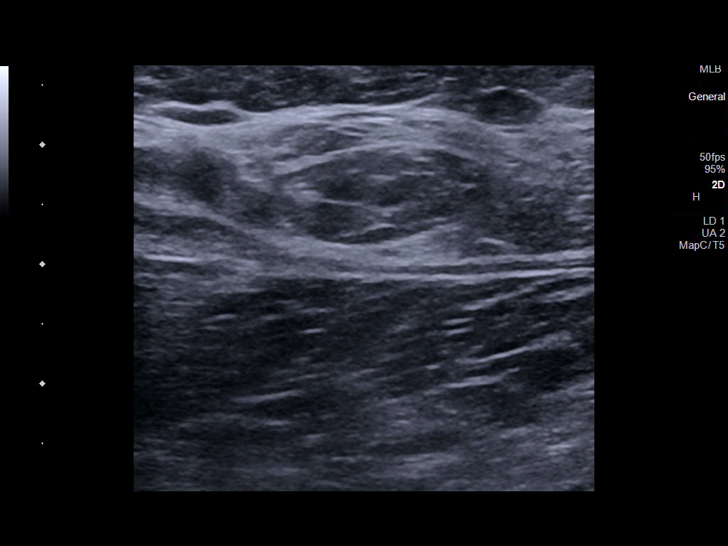
[im 15/18]
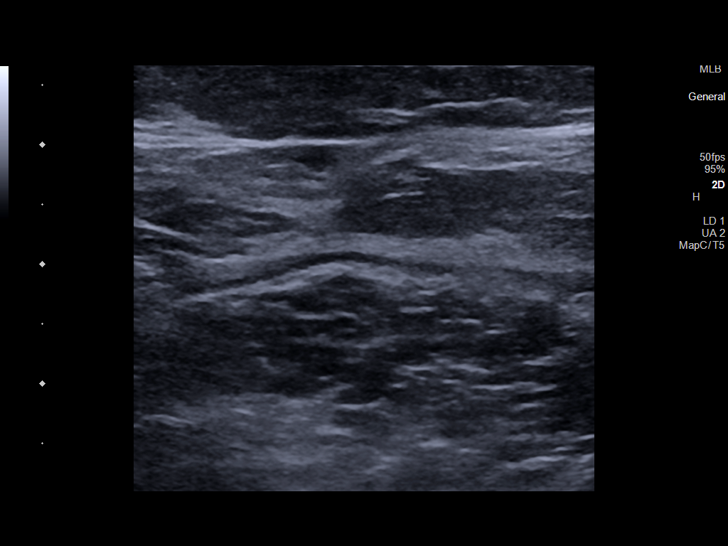
[im 17/18]
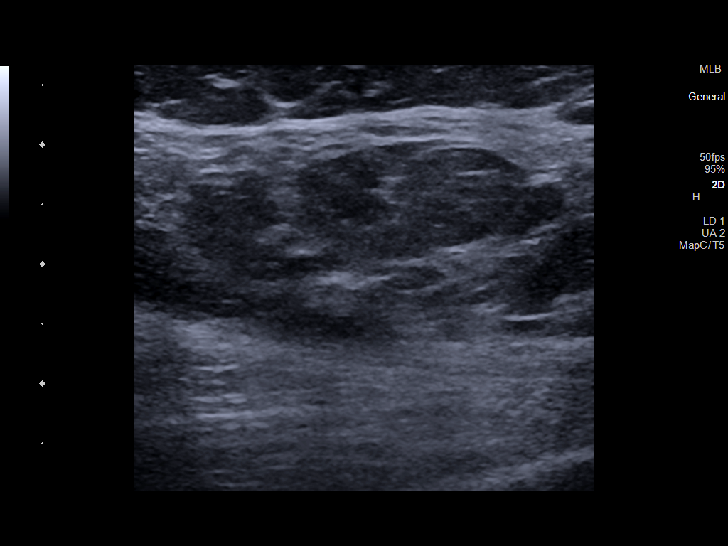
[im 18/18]
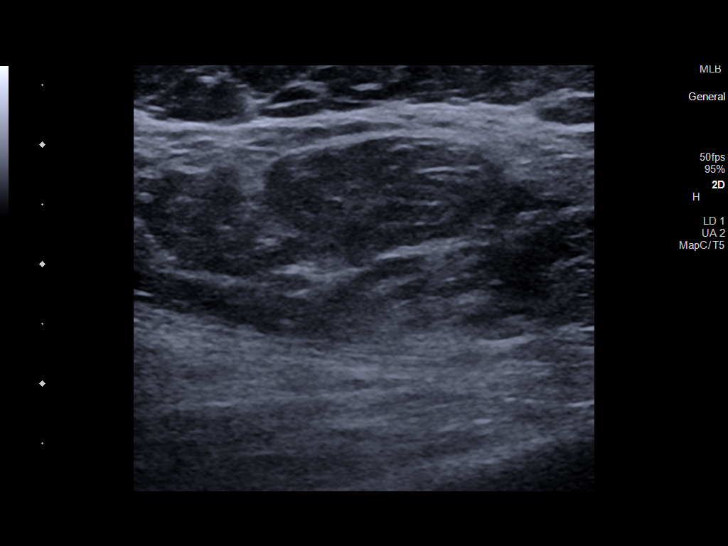

[14 of 18 positions shown; findings below may reference images not displayed]

FINDINGS: Targeted ultrasound examination of the right lateral back at the
patient identified site of palpable abnormality reveals normal skin,
subcutaneous fat, and underlying musculature without evidence of
mass, cyst, or other abnormality.
IMPRESSION: No evidence of mass, cyst, or other abnormality at the patient
identified site of the right lateral back.

## 2023-08-21 ENCOUNTER — Encounter: Payer: Self-pay | Admitting: Family Medicine

## 2023-08-21 ENCOUNTER — Telehealth: Payer: Self-pay

## 2023-08-21 ENCOUNTER — Ambulatory Visit (INDEPENDENT_AMBULATORY_CARE_PROVIDER_SITE_OTHER): Payer: 59 | Admitting: Family Medicine

## 2023-08-21 VITALS — BP 104/62 | HR 65 | Temp 98.5°F | Ht 66.5 in | Wt 194.0 lb

## 2023-08-21 DIAGNOSIS — E669 Obesity, unspecified: Secondary | ICD-10-CM

## 2023-08-21 DIAGNOSIS — Z0000 Encounter for general adult medical examination without abnormal findings: Secondary | ICD-10-CM

## 2023-08-21 DIAGNOSIS — Z23 Encounter for immunization: Secondary | ICD-10-CM

## 2023-08-21 DIAGNOSIS — Z0001 Encounter for general adult medical examination with abnormal findings: Secondary | ICD-10-CM | POA: Diagnosis not present

## 2023-08-21 DIAGNOSIS — Z125 Encounter for screening for malignant neoplasm of prostate: Secondary | ICD-10-CM | POA: Diagnosis not present

## 2023-08-21 DIAGNOSIS — R001 Bradycardia, unspecified: Secondary | ICD-10-CM

## 2023-08-21 DIAGNOSIS — Z114 Encounter for screening for human immunodeficiency virus [HIV]: Secondary | ICD-10-CM

## 2023-08-21 DIAGNOSIS — D2362 Other benign neoplasm of skin of left upper limb, including shoulder: Secondary | ICD-10-CM

## 2023-08-21 LAB — HEPATIC FUNCTION PANEL
ALT: 31 U/L (ref 0–53)
AST: 16 U/L (ref 0–37)
Albumin: 4.3 g/dL (ref 3.5–5.2)
Alkaline Phosphatase: 49 U/L (ref 39–117)
Bilirubin, Direct: 0.1 mg/dL (ref 0.0–0.3)
Total Bilirubin: 0.8 mg/dL (ref 0.2–1.2)
Total Protein: 6.9 g/dL (ref 6.0–8.3)

## 2023-08-21 LAB — CBC WITH DIFFERENTIAL/PLATELET
Basophils Absolute: 0 10*3/uL (ref 0.0–0.1)
Basophils Relative: 0.4 % (ref 0.0–3.0)
Eosinophils Absolute: 0 10*3/uL (ref 0.0–0.7)
Eosinophils Relative: 0.8 % (ref 0.0–5.0)
HCT: 47.2 % (ref 39.0–52.0)
Hemoglobin: 15.8 g/dL (ref 13.0–17.0)
Lymphocytes Relative: 47.5 % — ABNORMAL HIGH (ref 12.0–46.0)
Lymphs Abs: 2 10*3/uL (ref 0.7–4.0)
MCHC: 33.4 g/dL (ref 30.0–36.0)
MCV: 93.3 fL (ref 78.0–100.0)
Monocytes Absolute: 0.2 10*3/uL (ref 0.1–1.0)
Monocytes Relative: 5 % (ref 3.0–12.0)
Neutro Abs: 1.9 10*3/uL (ref 1.4–7.7)
Neutrophils Relative %: 46.3 % (ref 43.0–77.0)
Platelets: 197 10*3/uL (ref 150.0–400.0)
RBC: 5.06 Mil/uL (ref 4.22–5.81)
RDW: 13.3 % (ref 11.5–15.5)
WBC: 4.1 10*3/uL (ref 4.0–10.5)

## 2023-08-21 LAB — BASIC METABOLIC PANEL
BUN: 19 mg/dL (ref 6–23)
CO2: 30 meq/L (ref 19–32)
Calcium: 9.6 mg/dL (ref 8.4–10.5)
Chloride: 107 meq/L (ref 96–112)
Creatinine, Ser: 0.87 mg/dL (ref 0.40–1.50)
GFR: 95.62 mL/min (ref >60.00)
Glucose, Bld: 95 mg/dL (ref 70–99)
Potassium: 4.4 meq/L (ref 3.5–5.1)
Sodium: 142 meq/L (ref 135–145)

## 2023-08-21 LAB — LIPID PANEL
Cholesterol: 180 mg/dL (ref 0–200)
HDL: 64.6 mg/dL (ref >39.00)
LDL Cholesterol: 103 mg/dL — ABNORMAL HIGH (ref 0–99)
NonHDL: 115.15
Total CHOL/HDL Ratio: 3
Triglycerides: 62 mg/dL (ref 0.0–149.0)
VLDL: 12.4 mg/dL (ref 0.0–40.0)

## 2023-08-21 LAB — TSH: TSH: 1.11 u[IU]/mL (ref 0.35–5.50)

## 2023-08-21 LAB — PSA: PSA: 0.47 ng/mL (ref 0.10–4.00)

## 2023-08-21 NOTE — Assessment & Plan Note (Signed)
Ongoing issue.  BMI is a bit misleading due to his muscle mass.  Encouraged continued efforts at diet and exercise.  Will follow.

## 2023-08-21 NOTE — Assessment & Plan Note (Signed)
Pt's PE WNL w/ exception of bradycardia and BMI.  UTD on colonoscopy.  Tdap given today.  Check labs.  Anticipatory guidance provided.

## 2023-08-21 NOTE — Telephone Encounter (Signed)
I contacted patient to let him know that after his blood draw the phlebotomist accidentally punctured herself with the needle and he would need to come in for another blood draw per protocol for Hep B, Hep C, and HIV. Patient informed me he had already had HIV drawn today as part of his visit. I asked that he let me call him back so that I could receive clarification on this matter to be sure we are collecting what's needed and sending it to the correct place. Also needed billing clarification for the HIV. I reached out to health at work and am waiting for a response. Once they respond I will give the patient a call and he is aware.

## 2023-08-21 NOTE — Patient Instructions (Signed)
Follow up in 1 year or as needed We'll notify you of your lab results and make any changes if needed We'll call you to schedule your Dermatology and Cardiology appts Continue to drink LOTS of water since your heart rate is low Keep up the good work on healthy diet and regular exercise- you look great! Call with any questions or concerns Stay Safe!  Stay Healthy! Happy Valentine's Day!!

## 2023-08-21 NOTE — Telephone Encounter (Signed)
Called patient and let him know that I had clarification from [facility]. We will leave his HIV as is from his visit but we still needed to get the HEP B and HEP C. I have scheduled him for a lab visit on Monday and the req and paper work is in the lab.

## 2023-08-21 NOTE — Progress Notes (Signed)
   Subjective:    Patient ID: Daniel Mccoy, male    DOB: Aug 01, 1965, 58 y.o.   MRN: [id number]  HPI CPE- UTD on colonoscopy, due for Tdap  Patient Care Team    Relationship Specialty Notifications Start End  Sheliah Hatch, MD PCP - General   06/19/10     Health Maintenance  Topic Date Due   Pneumococcal Vaccine 23-77 Years old (1 of 2 - PCV) Never done   HIV Screening  Never done   INFLUENZA VACCINE  Never done   COVID-19 Vaccine (2 - 2024-25 season) 03/08/2023   DTaP/Tdap/Td (2 - Td or Tdap) 03/26/2023   Colonoscopy  03/03/2027   Hepatitis C Screening  Completed   Zoster Vaccines- Shingrix  Completed   HPV VACCINES  Aged Out      Review of Systems Patient reports no vision/hearing changes, anorexia, fever ,adenopathy, persistant/recurrent hoarseness, swallowing issues, chest pain, palpitations, edema, persistant/recurrent cough, hemoptysis, dyspnea (rest,exertional, paroxysmal nocturnal), gastrointestinal  bleeding (melena, rectal bleeding), abdominal pain, excessive heart burn, GU symptoms (dysuria, hematuria, voiding/incontinence issues) syncope, focal weakness, memory loss, numbness & tingling, skin/hair/nail changes, depression, anxiety, abnormal bruising/bleeding, musculoskeletal symptoms/signs.   + bradycardia- pt reports Apple Watch has been alerting him to low HR.  Didn't feel lightheaded.  No SOB.  + fatigue.  Pt reports baseline HR has always been in the 50s-60s.    Objective:   Physical Exam General Appearance:    Alert, cooperative, no distress, appears stated age  Head:    Normocephalic, without obvious abnormality, atraumatic  Eyes:    PERRL, conjunctiva/corneas clear, EOM's intact both eyes       Ears:    Normal TM's and external ear canals, both ears  Nose:   Nares normal, septum midline, mucosa normal, no drainage   or sinus tenderness  Throat:   Lips, mucosa, and tongue normal; teeth and gums normal  Neck:   Supple, symmetrical, trachea midline, no  adenopathy;       thyroid:  No enlargement/tenderness/nodules  Back:     Symmetric, no curvature, ROM normal, no CVA tenderness  Lungs:     Clear to auscultation bilaterally, respirations unlabored  Chest wall:    No tenderness or deformity  Heart:    Bradycardic, regular rhythm, S1 and S2 normal, no murmur, rub   or gallop  Abdomen:     Soft, non-tender, bowel sounds active all four quadrants,    no masses, no organomegaly  Genitalia:    deferred  Rectal:    Extremities:   Extremities normal, atraumatic, no cyanosis or edema  Pulses:   2+ and symmetric all extremities  Skin:   Skin color, texture, turgor normal, no rashes or lesions  Lymph nodes:   Cervical, supraclavicular, and axillary nodes normal  Neurologic:   CNII-XII intact. Normal strength, sensation and reflexes      throughout        Assessment & Plan:  Bradycardia- new.  Pt reports watch has reported that HR has been in the 40s for 10 minutes or more at a time.  He denies dizziness, CP, SOB.  + fatigue.  EKG w/o obvious abnormality.  Refer to Cards for complete evaluation and tx if needed.  Pt expressed understanding and is in agreement w/ plan.

## 2023-08-22 LAB — HIV ANTIBODY (ROUTINE TESTING W REFLEX): HIV 1&2 Ab, 4th Generation: NONREACTIVE

## 2023-08-24 ENCOUNTER — Telehealth: Payer: Self-pay

## 2023-08-24 ENCOUNTER — Other Ambulatory Visit: Payer: Self-pay

## 2023-08-24 ENCOUNTER — Encounter: Payer: Self-pay | Admitting: Family Medicine

## 2023-08-24 NOTE — Telephone Encounter (Signed)
 Pt has reviewed Via MyChart

## 2023-08-24 NOTE — Telephone Encounter (Signed)
-----   Message from Neena Rhymes sent at 08/24/2023  7:37 AM EST ----- Labs look great!  No changes at this time

## 2023-08-25 LAB — LAB REPORT - SCANNED: HM Hepatitis Screen: NEGATIVE

## 2023-11-16 ENCOUNTER — Ambulatory Visit: Attending: Cardiology | Admitting: Cardiology

## 2023-11-16 ENCOUNTER — Encounter: Payer: Self-pay | Admitting: Cardiology

## 2023-11-16 VITALS — BP 128/70 | HR 72 | Resp 16 | Ht 66.0 in | Wt 201.0 lb

## 2023-11-16 DIAGNOSIS — R9431 Abnormal electrocardiogram [ECG] [EKG]: Secondary | ICD-10-CM | POA: Diagnosis not present

## 2023-11-16 DIAGNOSIS — R001 Bradycardia, unspecified: Secondary | ICD-10-CM | POA: Diagnosis not present

## 2023-11-16 NOTE — Progress Notes (Signed)
"   Cardiology Office Note:  .   Date:  11/16/2023  ID:  Daniel Mccoy, DOB 1966-05-05, MRN [id number] PCP: Jess Morita, MD  [facility] Providers Cardiologist:  Fransico Ivy, MD PCP: Jess Morita, MD  Chief Complaint  Patient presents with   Bradycardia   New Patient (Initial Visit)     Daniel Mccoy is a 58 y.o. male with bradycardia, OSA  History of Present Illness  Patient is here with his wife today.  He works in [employer], does not smoke himself.  He walks 10,000-15,000 steps daily without recurrence of chest pain or shortness of breath.  He has a family history of CAD.  Recently, he was alerted by his Apple Watch that his heart rate was occasionally <50 bpm.  EKG performed at PCP visit showed heart rate of 49 bpm.  He denies any presyncope, syncope, chest pain, shortness of breath symptoms.  He does endorse snoring at night, as well as apneic episodes as evidenced by his wife.  He had CPAP machine minimal years ago, but has not used it recently.     Vitals:   11/16/23 0958  BP: 128/70  Pulse: 72  Resp: 16  SpO2: 99%      Review of Systems  Cardiovascular:  Negative for chest pain, dyspnea on exertion, leg swelling, palpitations and syncope.        Studies Reviewed: Daniel Mccoy        EKG 11/16/2023: Normal sinus rhythm Left axis deviation Left ventricular hypertrophy with QRS widening ( R in aVL , Cornell product ) When compared with ECG of 27-Sep-2019 15:35, No significant change was found    Independently interpreted 08/2023: Chol 180, TG 62, HDL 64, LDL 103 Hb 15.8 Cr 0.8 TSH 1.1   Physical Exam Vitals and nursing note reviewed.  Constitutional:      General: He is not in acute distress. Neck:     Vascular: No JVD.  Cardiovascular:     Rate and Rhythm: Normal rate and regular rhythm.     Heart sounds: Normal heart sounds. No murmur heard. Pulmonary:     Effort: Pulmonary effort is normal.     Breath  sounds: Normal breath sounds. No wheezing or rales.  Musculoskeletal:     Right lower leg: No edema.     Left lower leg: No edema.      VISIT DIAGNOSES:   ICD-10-CM   1. Bradycardia  R00.1 EKG 12-Lead    ECHOCARDIOGRAM COMPLETE    2. Abnormal EKG  R94.31 ECHOCARDIOGRAM COMPLETE       Daniel Mccoy is a 58 y.o. male with bradycardia, OSA Assessment & Plan  Bradycardia: Sinus bradycardia, most likely function of his baseline cardiac conditioning.  He walks 10,000-15,000 steps daily without any symptoms.  I do not think this needs further workup other than echocardiogram, given his LVH on EKG.  I encouraged him to continue his excellent physical conditioning.  OSA: Known OSA, currently not on treatment.  OSA could cause episodes of AV node conduction abnormalities during sleep.  Recommend sleep study, defer to PCP.  Primary prevention: Given family history of CAD, discussed diet and lifestyle modification.  Especially, encouraged him to reduce red meat intake.  Offered CT cardiac scoring scan.  Patient would like to hold off for now.  This can be ordered by PCP Dr. Paulla Bossier in future, if patient would prefer.  F/u as needed  Signed, Cody Das, MD

## 2023-11-16 NOTE — Patient Instructions (Signed)
 Testing/Procedures: ECHO  Your physician has requested that you have an echocardiogram. Echocardiography is a painless test that uses sound waves to create images of your heart. It provides your doctor with information about the size and shape of your heart and how well your heart's chambers and valves are working. This procedure takes approximately one hour. There are no restrictions for this procedure. Please do NOT wear cologne, perfume, aftershave, or lotions (deodorant is allowed). Please arrive 15 minutes prior to your appointment time.  Please note: We ask at that you not bring children with you during ultrasound (echo/ vascular) testing. Due to room size and safety concerns, children are not allowed in the ultrasound rooms during exams. Our front office staff cannot provide observation of children in our lobby area while testing is being conducted. An adult accompanying a patient to their appointment will only be allowed in the ultrasound room at the discretion of the ultrasound technician under special circumstances. We apologize for any inconvenience.   Follow-Up: At [facility], you and your health needs are our priority.  As part of our continuing mission to provide you with exceptional heart care, our providers are all part of one team.  This team includes your primary Cardiologist (physician) and Advanced Practice Providers or APPs (Physician Assistants and Nurse Practitioners) who all work together to provide you with the care you need, when you need it.  Your next appointment:   AS NEEDED  Provider:   Cody Das, MD

## 2023-12-21 ENCOUNTER — Ambulatory Visit (HOSPITAL_COMMUNITY)
Admission: RE | Admit: 2023-12-21 | Discharge: 2023-12-21 | Disposition: A | Discharge status: Home / Self Care | Source: Ambulatory Visit | Attending: Cardiology | Admitting: Cardiology

## 2023-12-21 DIAGNOSIS — R001 Bradycardia, unspecified: Secondary | ICD-10-CM

## 2023-12-21 DIAGNOSIS — R9431 Abnormal electrocardiogram [ECG] [EKG]: Secondary | ICD-10-CM

## 2023-12-21 LAB — ECHOCARDIOGRAM COMPLETE
Area-P 1/2: 4.29 cm2
S' Lateral: 2.83 cm

## 2023-12-22 ENCOUNTER — Ambulatory Visit: Payer: Self-pay | Admitting: Cardiology

## 2024-02-02 NOTE — Progress Notes (Signed)
 This encounter was created in error - please disregard.

## 2024-04-20 ENCOUNTER — Ambulatory Visit: Admitting: Physician Assistant

## 2024-04-20 ENCOUNTER — Encounter: Payer: Self-pay | Admitting: Physician Assistant

## 2024-04-20 VITALS — BP 158/82

## 2024-04-20 DIAGNOSIS — D239 Other benign neoplasm of skin, unspecified: Secondary | ICD-10-CM

## 2024-04-20 DIAGNOSIS — D2362 Other benign neoplasm of skin of left upper limb, including shoulder: Secondary | ICD-10-CM

## 2024-04-20 NOTE — Patient Instructions (Signed)

## 2024-04-20 NOTE — Progress Notes (Signed)
   New Patient Visit   Subjective  Daniel Mccoy is a 58 y.o. male NEW PATIENT who presents for the following: Bump of left hand that has been there for months. It was sore if he hit it but that has improved. He does not remember an injury in the area.    The following portions of the chart were reviewed this encounter and updated as appropriate: medications, allergies, medical history  Review of Systems:  No other skin or systemic complaints except as noted in HPI or Assessment and Plan.  Objective  Well appearing patient in no apparent distress; mood and affect are within normal limits.   A focused examination was performed of the following areas: BOTH HANDS    Relevant exam findings are noted in the Assessment and Plan.      Assessment & Plan   DERMATOFIBROMA Exam: 0.9 cm Firm pink/brown papulenodule with dimple sign of left wrist.  Treatment Plan: A dermatofibroma is a benign growth possibly related to trauma, such as an insect bite, cut from shaving, or inflamed acne-type bump.  Treatment options to remove include shave or excision with resulting scar and risk of recurrence.  Since benign-appearing and not bothersome, will observe for now. Call for gtrowth/concerns.    DERMATOFIBROMA    Return if symptoms worsen or fail to improve.  I, Roseline Hutchinson, CMA, am acting as scribe for Nabila Albarracin K, PA-C .   Documentation: I have reviewed the above documentation for accuracy and completeness, and I agree with the above.  Moshe Wenger K, PA-C

## 2024-08-26 ENCOUNTER — Encounter: Payer: 59 | Admitting: Family Medicine
